# Patient Record
Sex: Male | Born: 1970 | Race: White | Hispanic: No | Marital: Married | Smoking: Current every day smoker
Health system: Southern US, Community
[De-identification: ages and names within clinical notes are randomized; demographics above are authoritative.]

---

## 2005-11-17 ENCOUNTER — Emergency Department (HOSPITAL_COMMUNITY): Admission: EM | Admit: 2005-11-17 | Discharge: 2005-11-17 | Payer: Self-pay | Admitting: Emergency Medicine

## 2017-01-05 ENCOUNTER — Emergency Department (HOSPITAL_COMMUNITY): Payer: Self-pay

## 2017-01-05 ENCOUNTER — Emergency Department (HOSPITAL_COMMUNITY)
Admission: EM | Admit: 2017-01-05 | Discharge: 2017-01-05 | Disposition: A | Discharge status: Home / Self Care | Payer: Self-pay | Attending: Emergency Medicine | Admitting: Emergency Medicine

## 2017-01-05 ENCOUNTER — Encounter (HOSPITAL_COMMUNITY): Payer: Self-pay

## 2017-01-05 DIAGNOSIS — M79642 Pain in left hand: Secondary | ICD-10-CM | POA: Insufficient documentation

## 2017-01-05 DIAGNOSIS — Y929 Unspecified place or not applicable: Secondary | ICD-10-CM | POA: Insufficient documentation

## 2017-01-05 DIAGNOSIS — Y939 Activity, unspecified: Secondary | ICD-10-CM | POA: Insufficient documentation

## 2017-01-05 DIAGNOSIS — F17200 Nicotine dependence, unspecified, uncomplicated: Secondary | ICD-10-CM | POA: Insufficient documentation

## 2017-01-05 DIAGNOSIS — Y999 Unspecified external cause status: Secondary | ICD-10-CM | POA: Insufficient documentation

## 2017-01-05 DIAGNOSIS — W010XXA Fall on same level from slipping, tripping and stumbling without subsequent striking against object, initial encounter: Secondary | ICD-10-CM | POA: Insufficient documentation

## 2017-01-05 MED ORDER — IBUPROFEN 200 MG PO TABS
600.0000 mg | ORAL_TABLET | Freq: Once | ORAL | Status: AC
  Administered 2017-01-05: 600 mg via ORAL
  Filled 2017-01-05: qty 3

## 2017-01-05 NOTE — Discharge Instructions (Signed)
Please use ibuprofen as needed for pain. Please follow [place], ice, compression, elevation. Keep an brace on for most of the day for 1-2 weeks. Please follow up with hand surgery in one to 2 weeks regarding today's visit as needed.  Contact a health care provider if: Your pain does not get better after a few days of self-care. Your pain gets worse. Your pain affects your ability to do your daily activities. Get help right away if: Your hand becomes warm, red, or swollen. Your hand is numb or tingling. Your hand is extremely swollen or deformed. Your hand or fingers turn white or blue. You cannot move your hand, wrist, or fingers.

## 2017-01-05 NOTE — ED Triage Notes (Signed)
Fell on Friday and did left hand plant now pain in left wrist and swelling noted hurts to flex hand good csm noted.

## 2017-01-05 NOTE — ED Provider Notes (Signed)
[facility]-EMERGENCY DEPT Provider Note   CSN: [id number] Arrival date & time: 01/05/17  1903     History   Chief Complaint Chief Complaint  Patient presents with  . Hand Pain    HPI Michael Reeves is a 46 y.o. male presenting to the ED with complaints of left hand pain since Friday. He reports associated swelling. He states his symptoms have been unchanged, constant, 7/10. He reports falling on outstretched left hand on Friday and having symptoms since then. He states he has tried ibuprofen with mild to moderate relief. He reports flexing hand makes symptoms worse as well as palpation to the area. He denies fevers, chills, nausea, vomiting, previous injury.  The history is provided by the patient. No language interpreter was used.  Hand Pain     History reviewed. No pertinent past medical history.  There are no active problems to display for this patient.   History reviewed. No pertinent surgical history.     Home Medications    Prior to Admission medications   Not on File    Family History History reviewed. No pertinent family history.  Social History Social History  Substance Use Topics  . Smoking status: Current Every Day Smoker  . Smokeless tobacco: Never Used  . Alcohol use Yes     Allergies   Patient has no known allergies.   Review of Systems Review of Systems  Constitutional: Negative for chills and fever.  Musculoskeletal: Positive for arthralgias.     Physical Exam Updated Vital Signs BP (!) 156/97   Pulse 89   Temp 98.1 F (36.7 C) (Oral)   Resp 14   Ht  (1.778 m)   Wt 87.5 kg   SpO2 96%   BMI 27.69 kg/m   Physical Exam  Constitutional: He appears well-developed and well-nourished.  Well appearing  HENT:  Head: Normocephalic and atraumatic.  Nose: Nose normal.  Eyes: Conjunctivae and EOM are normal.  Neck: Normal range of motion.  Cardiovascular: Normal rate and intact distal pulses.   2+ distal pulses BUE.     Pulmonary/Chest: Effort normal. No respiratory distress.  Normal work of breathing. No respiratory distress noted.   Abdominal: Soft.  Musculoskeletal: Normal range of motion.  Left hand with mild swelling. TTP over dorsal aspect of wrist. No tenderness to anatomical snuffbox. No redness, no bruising, no deformity noted. Patient with limited range of motion secondary to pain and swelling. No tenderness or swelling to any other joints of hand, no tenderness or swelling to elbow.  Neurological: He is alert.  Sensation intact to BUE and distal to area affected. Muscle strength good against wrist flexion, extension.  Skin: Skin is warm.  Psychiatric: He has a normal mood and affect. His behavior is normal.  Nursing note and vitals reviewed.    ED Treatments / Results  Labs (all labs ordered are listed, but only abnormal results are displayed) Labs Reviewed - No data to display  EKG  EKG Interpretation None       Radiology Dg Wrist Complete Left  Result Date: 01/05/2017 CLINICAL DATA:  Pain in the left hand with swelling of the left wrist EXAM: LEFT WRIST - COMPLETE 3+ VIEW COMPARISON:  None. FINDINGS: No fracture or malalignment is visualized. No significant joint space narrowing. Probable cysts in the distal scaphoid and the triquetrum. Soft tissues are grossly unremarkable. IMPRESSION: No acute osseous abnormality. Electronically Signed   By: Jasmine Pang M.D.   On: 01/05/2017 20:08   Dg Hand  Complete Left  Result Date: 01/05/2017 CLINICAL DATA:  46 year old male tripped over dog and fell 2 days ago with left hand pain and swelling. Initial encounter. EXAM: LEFT HAND - COMPLETE 3+ VIEW COMPARISON:  None. FINDINGS: Grossly intact distal left radius and ulna, see also left wrist series today reported separately. Carpal bone alignment and joint spaces appear normal. Intact metacarpals. Intact phalanges. MCP and IP joint spaces appear within normal limits. IMPRESSION: No acute fracture  or dislocation identified about the left hand. Electronically Signed   By: Odessa Fleming M.D.   On: 01/05/2017 20:07    Procedures Procedures (including critical care time)  Medications Ordered in ED Medications  ibuprofen (ADVIL,MOTRIN) tablet 600 mg (600 mg Oral Given 01/05/17 2238)     Initial Impression / Assessment and Plan / ED Course  I have reviewed the triage vital signs and the nursing notes.  Pertinent labs & imaging results that were available during my care of the patient were reviewed by me and considered in my medical decision making (see chart for details).    Patient X-Ray negative for obvious fracture or dislocation. Pain managed in ED. Pt advised to follow up with orthopedics if symptoms persist for possibility of missed fracture diagnosis. Patient given brace while in ED, conservative therapy recommended and discussed. Patient in NAD, hemodynamically stable, afebrile. Patient will be dc home & is agreeable with above plan. Return precautions given.   Final Clinical Impressions(s) / ED Diagnoses   Final diagnoses:  Left hand pain    New Prescriptions New Prescriptions   No medications on file     [address] [place], [place] 01/05/17 2246    Laurence Spates, MD 01/06/17 [phone]

## 2017-03-24 ENCOUNTER — Ambulatory Visit (HOSPITAL_COMMUNITY)
Admission: EM | Admit: 2017-03-24 | Discharge: 2017-03-24 | Disposition: A | Discharge status: Home / Self Care | Payer: Self-pay | Attending: Family Medicine | Admitting: Family Medicine

## 2017-03-24 ENCOUNTER — Encounter (HOSPITAL_COMMUNITY): Payer: Self-pay | Admitting: Family Medicine

## 2017-03-24 DIAGNOSIS — S01511A Laceration without foreign body of lip, initial encounter: Secondary | ICD-10-CM

## 2017-03-24 MED ORDER — LIDOCAINE-EPINEPHRINE (PF) 2 %-1:200000 IJ SOLN
INTRAMUSCULAR | Status: AC
  Filled 2017-03-24: qty 20

## 2017-03-24 NOTE — ED Provider Notes (Signed)
CSN: [id number]     Arrival date & time 03/24/17  1943 History   None    Chief Complaint  Patient presents with  . Laceration   (Consider location/radiation/quality/duration/timing/severity/associated sxs/prior Treatment) Patient c/o upper right lip laceration.   The history is provided by the patient.  Laceration  Location:  Face Facial laceration location:  Upper lip Length:  3 cm Depth:  Cutaneous Quality: avulsion   Bleeding: venous   Time since incident:  2 hours Laceration mechanism:  Blunt object Pain details:    Quality:  Aching   Severity:  Moderate   Timing:  Constant Foreign body present:  No foreign bodies Relieved by:  Nothing Worsened by:  Nothing Tetanus status:  Up to date   History reviewed. No pertinent past medical history. History reviewed. No pertinent surgical history. History reviewed. No pertinent family history. Social History  Substance Use Topics  . Smoking status: Current Every Day Smoker  . Smokeless tobacco: Never Used  . Alcohol use Yes    Review of Systems  Constitutional: Negative.   HENT: Negative.   Eyes: Negative.   Respiratory: Negative.   Cardiovascular: Negative.   Gastrointestinal: Negative.   Endocrine: Negative.   Genitourinary: Negative.   Musculoskeletal: Negative.   Skin: Positive for wound.  Allergic/Immunologic: Negative.   Neurological: Negative.   Hematological: Negative.   Psychiatric/Behavioral: Negative.     Allergies  Patient has no known allergies.  Home Medications   Prior to Admission medications   Not on File   Meds Ordered and Administered this Visit  Medications - No data to display  BP (!) 143/86   Pulse (!) 103   Temp 98.2 F (36.8 C)   Resp 18   SpO2 98%  No data found.   Physical Exam  Constitutional: He appears well-developed and well-nourished.  HENT:  Head: Normocephalic and atraumatic.  Eyes: Conjunctivae and EOM are normal. Pupils are equal, round, and reactive to  light.  Neck: Normal range of motion. Neck supple.  Cardiovascular: Normal rate, regular rhythm and normal heart sounds.   Pulmonary/Chest: Effort normal.  Skin:  Right upper lip laceration with flap approx 3 cm long.  Nursing note and vitals reviewed.   Urgent Care Course     .Marland Kitchen.Laceration Repair Date/Time: 03/24/2017 8:43 PM Performed by: Deatra CanterXFORD, WILLIAM J Authorized by: Deatra CanterXFORD, WILLIAM J   Consent:    Consent obtained:  Verbal   Consent given by:  Patient   Risks discussed:  Infection and pain   Alternatives discussed:  No treatment Anesthesia (see MAR for exact dosages):    Anesthesia method:  Local infiltration   Local anesthetic:  Lidocaine 1% WITH epi Laceration details:    Location:  Lip   Lip location:  Upper exterior lip   Length (cm):  3   Depth (mm):  3 Repair type:    Repair type:  Simple Pre-procedure details:    Preparation:  Patient was prepped and draped in usual sterile fashion Exploration:    Hemostasis achieved with:  Direct pressure   Wound exploration: wound explored through full range of motion     Wound extent: areolar tissue violated     Contaminated: no   Treatment:    Area cleansed with:  Betadine and saline   Amount of cleaning:  Standard   Irrigation solution:  Sterile saline   Irrigation volume:  120 ml   Irrigation method:  Syringe   Visualized foreign bodies/material removed: no   Skin repair:  Repair method:  Sutures   Suture size:  5-0   Suture material:  Nylon   Suture technique:  Simple interrupted   Number of sutures:  8 Approximation:    Approximation:  Close   Vermilion border: well-aligned   Post-procedure details:    Dressing:  Antibiotic ointment   Patient tolerance of procedure:  Tolerated well, no immediate complications Comments:     Horizontal suture placed through tip of flap and secured above flap and then # 7 simple sutures placed to approximate edges.   (including critical care time)  Labs Review Labs  Reviewed - No data to display  Imaging Review No results found.   Visual Acuity Review  Right Eye Distance:   Left Eye Distance:   Bilateral Distance:    Right Eye Near:   Left Eye Near:    Bilateral Near:         MDM   1. Lip laceration, initial encounter    #8 sutures placed with 5.0 nylon      Deatra Canter, [place] 03/24/17 2047

## 2017-03-24 NOTE — Discharge Instructions (Signed)
Follow up in 7 - 10 days for suture removal.  Placed 8 sutures.

## 2017-03-24 NOTE — ED Triage Notes (Signed)
Pt has laceration to left upper lip. sts he is up to date on tetanus.

## 2017-06-14 ENCOUNTER — Encounter (HOSPITAL_COMMUNITY): Payer: Self-pay | Admitting: Emergency Medicine

## 2017-06-14 ENCOUNTER — Inpatient Hospital Stay (HOSPITAL_COMMUNITY)
Admission: EM | Admit: 2017-06-14 | Discharge: 2017-06-18 | DRG: 372 | Disposition: A | Discharge status: Home / Self Care | Payer: Self-pay | Attending: Internal Medicine | Admitting: Internal Medicine

## 2017-06-14 ENCOUNTER — Emergency Department (HOSPITAL_COMMUNITY): Payer: Self-pay

## 2017-06-14 DIAGNOSIS — F1010 Alcohol abuse, uncomplicated: Secondary | ICD-10-CM

## 2017-06-14 DIAGNOSIS — Z806 Family history of leukemia: Secondary | ICD-10-CM

## 2017-06-14 DIAGNOSIS — E876 Hypokalemia: Secondary | ICD-10-CM | POA: Diagnosis present

## 2017-06-14 DIAGNOSIS — K760 Fatty (change of) liver, not elsewhere classified: Secondary | ICD-10-CM | POA: Diagnosis present

## 2017-06-14 DIAGNOSIS — A0472 Enterocolitis due to Clostridium difficile, not specified as recurrent: Principal | ICD-10-CM

## 2017-06-14 DIAGNOSIS — D696 Thrombocytopenia, unspecified: Secondary | ICD-10-CM

## 2017-06-14 DIAGNOSIS — R197 Diarrhea, unspecified: Secondary | ICD-10-CM

## 2017-06-14 DIAGNOSIS — Z800 Family history of malignant neoplasm of digestive organs: Secondary | ICD-10-CM

## 2017-06-14 DIAGNOSIS — D649 Anemia, unspecified: Secondary | ICD-10-CM

## 2017-06-14 DIAGNOSIS — R1310 Dysphagia, unspecified: Secondary | ICD-10-CM

## 2017-06-14 DIAGNOSIS — K766 Portal hypertension: Secondary | ICD-10-CM | POA: Diagnosis present

## 2017-06-14 DIAGNOSIS — K7030 Alcoholic cirrhosis of liver without ascites: Secondary | ICD-10-CM

## 2017-06-14 DIAGNOSIS — B3781 Candidal esophagitis: Secondary | ICD-10-CM | POA: Diagnosis present

## 2017-06-14 DIAGNOSIS — F1020 Alcohol dependence, uncomplicated: Secondary | ICD-10-CM | POA: Diagnosis present

## 2017-06-14 DIAGNOSIS — N179 Acute kidney failure, unspecified: Secondary | ICD-10-CM | POA: Diagnosis present

## 2017-06-14 DIAGNOSIS — Z23 Encounter for immunization: Secondary | ICD-10-CM

## 2017-06-14 DIAGNOSIS — K7010 Alcoholic hepatitis without ascites: Secondary | ICD-10-CM | POA: Diagnosis present

## 2017-06-14 DIAGNOSIS — K449 Diaphragmatic hernia without obstruction or gangrene: Secondary | ICD-10-CM | POA: Diagnosis present

## 2017-06-14 DIAGNOSIS — R778 Other specified abnormalities of plasma proteins: Secondary | ICD-10-CM

## 2017-06-14 DIAGNOSIS — R531 Weakness: Secondary | ICD-10-CM

## 2017-06-14 DIAGNOSIS — Z825 Family history of asthma and other chronic lower respiratory diseases: Secondary | ICD-10-CM

## 2017-06-14 DIAGNOSIS — K219 Gastro-esophageal reflux disease without esophagitis: Secondary | ICD-10-CM | POA: Diagnosis present

## 2017-06-14 DIAGNOSIS — R7989 Other specified abnormal findings of blood chemistry: Secondary | ICD-10-CM

## 2017-06-14 DIAGNOSIS — F17210 Nicotine dependence, cigarettes, uncomplicated: Secondary | ICD-10-CM | POA: Diagnosis present

## 2017-06-14 LAB — URINALYSIS, ROUTINE W REFLEX MICROSCOPIC
Bacteria, UA: NONE SEEN
Bilirubin Urine: NEGATIVE
Glucose, UA: NEGATIVE mg/dL
Ketones, ur: NEGATIVE mg/dL
Leukocytes, UA: NEGATIVE
Nitrite: NEGATIVE
Protein, ur: NEGATIVE mg/dL
SPECIFIC GRAVITY, URINE: 1.004 — AB (ref 1.005–1.030)
pH: 7 (ref 5.0–8.0)

## 2017-06-14 LAB — CBC
HCT: 32.1 % — ABNORMAL LOW (ref 39.0–52.0)
Hemoglobin: 11.8 g/dL — ABNORMAL LOW (ref 13.0–17.0)
MCH: 36 pg — AB (ref 26.0–34.0)
MCHC: 36.8 g/dL — AB (ref 30.0–36.0)
MCV: 97.9 fL (ref 78.0–100.0)
Platelets: 149 10*3/uL — ABNORMAL LOW (ref 150–400)
RBC: 3.28 MIL/uL — ABNORMAL LOW (ref 4.22–5.81)
RDW: 13.2 % (ref 11.5–15.5)
WBC: 14.9 10*3/uL — ABNORMAL HIGH (ref 4.0–10.5)

## 2017-06-14 LAB — MAGNESIUM: Magnesium: 1 mg/dL — ABNORMAL LOW (ref 1.7–2.4)

## 2017-06-14 LAB — TROPONIN I: Troponin I: 0.1 ng/mL (ref <0.03)

## 2017-06-14 LAB — PHOSPHORUS: Phosphorus: 3 mg/dL (ref 2.5–4.6)

## 2017-06-14 LAB — LIPASE, BLOOD: Lipase: 84 U/L — ABNORMAL HIGH (ref 11–51)

## 2017-06-14 MED ORDER — MAGNESIUM SULFATE 50 % IJ SOLN: 2.0000 g | Freq: Once | INTRAMUSCULAR | Status: DC

## 2017-06-14 MED ORDER — POTASSIUM CHLORIDE 10 MEQ/100ML IV SOLN
10.0000 meq | INTRAVENOUS | Status: AC
  Administered 2017-06-14 (×2): 10 meq via INTRAVENOUS
  Filled 2017-06-14 (×2): qty 100

## 2017-06-14 MED ORDER — MAGNESIUM SULFATE 2 GM/50ML IV SOLN
2.0000 g | Freq: Once | INTRAVENOUS | Status: AC
  Administered 2017-06-14: 2 g via INTRAVENOUS
  Filled 2017-06-14: qty 50

## 2017-06-14 MED ORDER — POTASSIUM CHLORIDE CRYS ER 20 MEQ PO TBCR
40.0000 meq | EXTENDED_RELEASE_TABLET | Freq: Once | ORAL | Status: AC
  Administered 2017-06-14: 40 meq via ORAL
  Filled 2017-06-14: qty 2

## 2017-06-14 MED ORDER — SODIUM CHLORIDE 0.9 % IV BOLUS (SEPSIS)
1000.0000 mL | Freq: Once | INTRAVENOUS | Status: AC
  Administered 2017-06-14: 1000 mL via INTRAVENOUS

## 2017-06-14 NOTE — ED Triage Notes (Addendum)
Patient c/o generalized abdominal pain and N/V/D x5 days. Denies SOB and chest pain. Ambulatory.

## 2017-06-14 NOTE — ED Notes (Signed)
Date and time results received: 06/14/17 9:30 PM (use smartphrase ".now" to insert current time)  Test: Troponin Critical Value: 0.10  Name of Provider Notified: Fayrene Helper, EDPA  Orders Received? Or Actions Taken?: None at this time

## 2017-06-14 NOTE — ED Notes (Signed)
RN may call report to [facility] [id number] at [phone].

## 2017-06-14 NOTE — ED Notes (Signed)
ED TO INPATIENT HANDOFF REPORT  Name/Age/Gender Michael Reeves 46 y.o. male  Code Status Code Status History    This patient does not have a recorded code status. Please follow your organizational policy for patients in this situation.      Home/SNF/Other Home  Chief Complaint Nausea and Blurry Vision and Cant hold any Fluids   Level of Care/Admitting Diagnosis ED Disposition    ED Disposition Condition Comment   Admit  Hospital Area: [place] [[id number]]  Level of Care: Telemetry [5]  Admit to tele based on following criteria: Monitor for Ischemic changes  Diagnosis: Hypokalemia [665993]  Admitting Physician: Jani Gravel [[id number]]  Attending Physician: Jani Gravel [phone]  Estimated length of stay: past midnight tomorrow  Certification:: I certify this patient will need inpatient services for at least 2 midnights  PT Class (Do Not Modify): Inpatient [101]  PT Acc Code (Do Not Modify): Private [1]       Medical History History reviewed. No pertinent past medical history.  Allergies No Known Allergies  IV Location/Drains/Wounds Patient Lines/Drains/Airways Status   Active Line/Drains/Airways    Name:   Placement date:   Placement time:   Site:   Days:   Peripheral IV 06/14/17 Left Forearm  06/14/17    1956    Forearm    less than 1   Peripheral IV 06/14/17 Right Antecubital  06/14/17    2135    Antecubital    less than 1          Labs/Imaging Results for orders placed or performed during the hospital encounter of 06/14/17 (from the past 48 hour(s))  CBC     Status: Abnormal   Collection Time: 06/14/17  6:25 PM  Result Value Ref Range   WBC 14.9 (H) 4.0 - 10.5 K/uL   RBC 3.28 (L) 4.22 - 5.81 MIL/uL   Hemoglobin 11.8 (L) 13.0 - 17.0 g/dL   HCT 32.1 (L) 39.0 - 52.0 %   MCV 97.9 78.0 - 100.0 fL   MCH 36.0 (H) 26.0 - 34.0 pg   MCHC 36.8 (H) 30.0 - 36.0 g/dL   RDW 13.2 11.5 - 15.5 %   Platelets 149 (L) 150 - 400 K/uL  Comprehensive metabolic  panel     Status: Abnormal   Collection Time: 06/14/17  7:42 PM  Result Value Ref Range   Sodium 136 135 - 145 mmol/L   Potassium 1.2 (LL) 3.5 - 5.1 mmol/L    Comment: CRITICAL RESULT CALLED TO, READ BACK BY AND VERIFIED WITH: K GILLESPIE,RN @2034  06/14/17 MKELLY    Chloride 86 (L) 101 - 111 mmol/L   CO2 36 (H) 22 - 32 mmol/L   Glucose, Bld 98 65 - 99 mg/dL   BUN 7 6 - 20 mg/dL   Creatinine, Ser 1.27 (H) 0.61 - 1.24 mg/dL   Calcium 6.9 (L) 8.9 - 10.3 mg/dL   Total Protein 6.8 6.5 - 8.1 g/dL   Albumin 3.3 (L) 3.5 - 5.0 g/dL   AST 104 (H) 15 - 41 U/L   ALT 28 17 - 63 U/L   Alkaline Phosphatase 82 38 - 126 U/L   Total Bilirubin 2.1 (H) 0.3 - 1.2 mg/dL   GFR calc non Af Amer >60 >60 mL/min   GFR calc Af Amer >60 >60 mL/min    Comment: (NOTE) The eGFR has been calculated using the CKD EPI equation. This calculation has not been validated in all clinical situations. eGFR's persistently <60 mL/min signify possible Chronic  Kidney Disease.    Anion gap 14 5 - 15  Lipase, blood     Status: Abnormal   Collection Time: 06/14/17  7:42 PM  Result Value Ref Range   Lipase 84 (H) 11 - 51 U/L  Phosphorus     Status: None   Collection Time: 06/14/17  8:54 PM  Result Value Ref Range   Phosphorus 3.0 2.5 - 4.6 mg/dL  Magnesium     Status: Abnormal   Collection Time: 06/14/17  8:54 PM  Result Value Ref Range   Magnesium 1.0 (L) 1.7 - 2.4 mg/dL  Troponin I     Status: Abnormal   Collection Time: 06/14/17  9:03 PM  Result Value Ref Range   Troponin I 0.10 (HH) <0.03 ng/mL    Comment: CRITICAL RESULT CALLED TO, READ BACK BY AND VERIFIED WITH: S DOSTER,RN @2129  06/14/17 MKELLY   Urinalysis, Routine w reflex microscopic     Status: Abnormal   Collection Time: 06/14/17 10:39 PM  Result Value Ref Range   Color, Urine YELLOW YELLOW   APPearance CLEAR CLEAR   Specific Gravity, Urine 1.004 (L) 1.005 - 1.030   pH 7.0 5.0 - 8.0   Glucose, UA NEGATIVE NEGATIVE mg/dL   Hgb urine dipstick  MODERATE (A) NEGATIVE   Bilirubin Urine NEGATIVE NEGATIVE   Ketones, ur NEGATIVE NEGATIVE mg/dL   Protein, ur NEGATIVE NEGATIVE mg/dL   Nitrite NEGATIVE NEGATIVE   Leukocytes, UA NEGATIVE NEGATIVE   RBC / HPF 0-5 0 - 5 RBC/hpf   WBC, UA 0-5 0 - 5 WBC/hpf   Bacteria, UA NONE SEEN NONE SEEN   Squamous Epithelial / LPF 0-5 (A) NONE SEEN   Dg Chest 2 View  Result Date: 06/14/2017 CLINICAL DATA:  Dehydrated, weak EXAM: CHEST  2 VIEW COMPARISON:  None. FINDINGS: The heart size and mediastinal contours are within normal limits. Both lungs are clear. The visualized skeletal structures are unremarkable. IMPRESSION: No active cardiopulmonary disease. Electronically Signed   By: Donavan Foil M.D.   On: 06/14/2017 22:38    Pending Labs Unresulted Labs    None      Vitals/Pain Today's Vitals   06/14/17 1826 06/14/17 1952 06/14/17 2052 06/14/17 2143  BP:    111/72  Pulse:    72  Resp:    17  Temp:   98.5 F (36.9 C)   TempSrc:      SpO2:    97%  Weight:  175 lb (79.4 kg)    Height:  5' 10"  (1.778 m)    PainSc: 5        Isolation Precautions No active isolations  Medications Medications  sodium chloride 0.9 % bolus 1,000 mL (0 mLs Intravenous Stopped 06/14/17 2200)  potassium chloride 10 mEq in 100 mL IVPB (10 mEq Intravenous New Bag/Given 06/14/17 2205)  potassium chloride SA (K-DUR,KLOR-CON) CR tablet 40 mEq (40 mEq Oral Given 06/14/17 2113)  magnesium sulfate IVPB 2 g 50 mL (0 g Intravenous Stopped 06/14/17 2304)    Mobility walks

## 2017-06-14 NOTE — ED Notes (Signed)
Seizure pads in place for safety due to low potassium

## 2017-06-14 NOTE — H&P (Signed)
TRH H&P   Patient Demographics:    Michael Reeves, is a 46 y.o. male  MRN: [id number]   DOB - 1971/04/19  Admit Date - 06/14/2017  Outpatient Primary MD for the patient is Patient, No Pcp Per  Referring MD/NP/PA: Domenic Moras  Outpatient Specialists:   Patient coming from: home  Chief Complaint  Patient presents with  . Abdominal Pain      HPI:    Michael Reeves  is a 46 y.o. male, w etoh dep, who apparently drinks about 3/4 5th of liquor per day presents with c/o n/v, and dysphagia to solids/liquids since Monday as well as diarrhea 1-4x per day since Tuesday.  Pt denies fever, chills, abd pain, constipation, brbpr, black stool, dysuria, hematuria.  Pt denies any odd food eaten or recent travel.  He works [employer]  [place].  Pt felt generally weak and has chronic heartburn.    In ED,  Pt found to have severe hypokalemia with K=1.2, magnesium =   1.0,. Ast 104, Alt 28  > 3 to 1 ratio which correlates with his history of etoh dep.  Trop 0.1, EKG NSR at 85, nl axis, nl pr, prolonged qtc, st depression in 2, 3, avf, and v3-6.  Pt denies chest pain.  Pt will be admitted for profound hypokalemia likely due to etoh use, lipase elevation r/o pancreatitis and troponin elevation. (asymptomatic), r/o alcoholic cardiomyopathy    Review of systems:    In addition to the HPI above,  No Fever-chills, No Headache, No changes with Vision or hearing, No problems swallowing food or Liquids, No Chest pain, Cough or Shortness of Breath, No Abdominal pain,  No Blood in stool or Urine, No dysuria, No new skin rashes or bruises, No new joints pains-aches,  No new weakness, tingling, numbness in any extremity, No recent weight gain or loss, No polyuria, polydypsia or polyphagia, No significant Mental Stressors.  A full 10 point Review of Systems was done, except as stated above, all  other Review of Systems were negative.   With Past History of the following :    History reviewed. No pertinent past medical history.    History reviewed. No pertinent surgical history.    Social History:     Social History  Substance Use Topics  . Smoking status: Current Every Day Smoker  . Smokeless tobacco: Never Used  . Alcohol use Yes     Lives - at home Mobility -  Walks by self   Family History :     Family History  Problem Relation Age of Onset  . Cancer - Colon Mother   . COPD Mother   . Acute myelogenous leukemia Father       Home Medications:   Prior to Admission medications   Medication Sig Start Date End Date Taking? Authorizing Provider  ibuprofen (ADVIL,MOTRIN) 200 MG tablet Take  600 mg by mouth every 6 (six) hours as needed for moderate pain.   Yes [provider]     Allergies:    No Known Allergies   Physical Exam:   Vitals  Blood pressure 111/72, pulse 72, temperature 98.5 F (36.9 C), resp. rate 17, height 5' 10"  (1.778 m), weight 79.4 kg (175 lb), SpO2 97 %.   1. General  lying in bed in NAD,   2. Normal affect and insight, Not Suicidal or Homicidal, Awake Alert, Oriented X 3.  3. No F.N deficits, ALL C.Nerves Intact, Strength 5/5 all 4 extremities, Sensation intact all 4 extremities, Plantars down going.  4. Ears and Eyes appear Normal, Conjunctivae clear, PERRLA. Moist Oral Mucosa.  5. Supple Neck, No JVD, No cervical lymphadenopathy appriciated, No Carotid Bruits.  6. Symmetrical Chest wall movement, Good air movement bilaterally, CTAB.  7. RRR, No Gallops, Rubs or Murmurs, No Parasternal Heave.  8. Positive Bowel Sounds, Abdomen Soft, No tenderness, No organomegaly appriciated,No rebound -guarding or rigidity.  9.  No Cyanosis, Normal Skin Turgor, No Skin Rash or Bruise.  10. Good muscle tone,  joints appear normal , no effusions, Normal ROM.  11. No Palpable Lymph Nodes in Neck or Axillae  No palmar  erythema, no asterixis.    Data Review:    CBC  Recent Labs Lab 06/14/17 1825  WBC 14.9*  HGB 11.8*  HCT 32.1*  PLT 149*  MCV 97.9  MCH 36.0*  MCHC 36.8*  RDW 13.2   ------------------------------------------------------------------------------------------------------------------  Chemistries   Recent Labs Lab 06/14/17 1942 06/14/17 2054  NA 136  --   K 1.2*  --   CL 86*  --   CO2 36*  --   GLUCOSE 98  --   BUN 7  --   CREATININE 1.27*  --   CALCIUM 6.9*  --   MG  --  1.0*  AST 104*  --   ALT 28  --   ALKPHOS 82  --   BILITOT 2.1*  --    ------------------------------------------------------------------------------------------------------------------ estimated creatinine clearance is 74.2 mL/min (A) (by C-G formula based on SCr of 1.27 mg/dL (H)). ------------------------------------------------------------------------------------------------------------------ No results for input(s): TSH, T4TOTAL, T3FREE, THYROIDAB in the last 72 hours.  Invalid input(s): FREET3  Coagulation profile No results for input(s): INR, PROTIME in the last 168 hours. ------------------------------------------------------------------------------------------------------------------- No results for input(s): DDIMER in the last 72 hours. -------------------------------------------------------------------------------------------------------------------  Cardiac Enzymes  Recent Labs Lab 06/14/17 2103  TROPONINI 0.10*   ------------------------------------------------------------------------------------------------------------------ No results found for: BNP   ---------------------------------------------------------------------------------------------------------------  Urinalysis    Component Value Date/Time   COLORURINE YELLOW 06/14/2017 2239   APPEARANCEUR CLEAR 06/14/2017 2239   LABSPEC 1.004 (L) 06/14/2017 2239   PHURINE 7.0 06/14/2017 2239   GLUCOSEU NEGATIVE  06/14/2017 2239   HGBUR MODERATE (A) 06/14/2017 2239   BILIRUBINUR NEGATIVE 06/14/2017 2239   KETONESUR NEGATIVE 06/14/2017 2239   PROTEINUR NEGATIVE 06/14/2017 2239   NITRITE NEGATIVE 06/14/2017 2239   LEUKOCYTESUR NEGATIVE 06/14/2017 2239    ----------------------------------------------------------------------------------------------------------------   Imaging Results:    Dg Chest 2 View  Result Date: 06/14/2017 CLINICAL DATA:  Dehydrated, weak EXAM: CHEST  2 VIEW COMPARISON:  None. FINDINGS: The heart size and mediastinal contours are within normal limits. Both lungs are clear. The visualized skeletal structures are unremarkable. IMPRESSION: No active cardiopulmonary disease. Electronically Signed   By: Donavan Foil M.D.   On: 06/14/2017 22:38      Assessment & Plan:    Principal Problem:  Hypokalemia Active Problems:   Hypomagnesemia   Elevated troponin   Anemia   Thrombocytopenia (HCC)   Dysphagia   Diarrhea    Hypokalemia Replete Check cmp in am  Hypomagnesemia Replete Check magnesium in am  Diarrhea Gi pathogen panel Check TSH Consider check gastrin,, 5 HIAA, celiac (defer to GI) immodium  Lipase elevation/ Abnormal LFT Check acute hepatitis panel Check CT scan abd/ pelvis R/o pancreatitis and to evaluate abnormal liver function r/o cirrhosis  N/v Check CT abd/ pelvis  Dysphagia NPO after MN Please consult GI in AM for EGD, r/o stricture, zenker, achlasia, esophagitis, malignancy protonix 63m iv bid  Anemia Check ferritin , iron, tibc, b12, folate, esr  Troponin elevation Trop I q6h x3 Check cardiac echo  ETOH dep Librium 522mpo qday x 1 day then 2529moq day x 1 day CIWA   DVT Prophylaxis lovenox, SCDs   AM Labs Ordered, also please review Full Orders  Family Communication: Admission, patients condition and plan of care including tests being ordered have been discussed with the patient  who indicate understanding and agree with  the plan and Code Status.  Code Status FULL CODE  Likely DC to  home  Condition GUARDED   Consults called: please call GI in am to evaluate dysphagia  Admission status: inpatient  Time spent in minutes : 45 minutes   JamJani GravelD on 06/14/2017 at 11:52 PM  Between 7am to 7pm - Pager - [phone] 7pm go to [email or web address] - password [email or web address] - Office  [phone]

## 2017-06-14 NOTE — ED Notes (Signed)
ED Provider at bedside. 

## 2017-06-14 NOTE — ED Provider Notes (Signed)
[facility]-EMERGENCY DEPT Provider Note   CSN: [id number] Arrival date & time: 06/14/17  1714     History   Chief Complaint Chief Complaint  Patient presents with  . Abdominal Pain    HPI Michael Reeves is a 46 y.o. male.  HPI   46 year old male presenting with trouble swallowing.  New onset of vomiting and watery diarrhea x 5 days.  Notices it most when working outside in the heat. Abd pain is intermittent only when vomits.  Has tried drinking water and gatorade but vomits when taking big gulps. Report difficulty swallowing food.  sts he eats, having difficulty swallowing and have to vomit undigested food immediately.  Reports blurry vision, and lighthededness as well as generalized muscle fatigues for the same duration.  No cp, sob.  Does admits to chronic NSAIDs use for regular aches and pain.  Hx of chronic alcohol use, quantify as six packs a day however family member at bedside states patient drinks a fifth of liquor daily.  Is a smoker and smoke 2pks/day.  Hx of chronic untreated heart burn.  Denies hematochezia, melena, hematemesis, dark stool.  No fever, or chills, no sick contact, travel hx, new exposures.  Report watery diarrhea 4 times daily.  Does report increasing stress level.  Does not have a PCP.  Denies recent abx use.    History reviewed. No pertinent past medical history.  There are no active problems to display for this patient.   History reviewed. No pertinent surgical history.     Home Medications    Prior to Admission medications   Not on File    Family History History reviewed. No pertinent family history.  Social History Social History  Substance Use Topics  . Smoking status: Current Every Day Smoker  . Smokeless tobacco: Never Used  . Alcohol use Yes     Allergies   Patient has no known allergies.   Review of Systems Review of Systems  All other systems reviewed and are negative.    Physical Exam Updated Vital Signs BP 100/76 (BP  Location: Left Arm)   Pulse 100   Temp 98.5 F (36.9 C) (Oral)   Resp 18   Ht  (1.778 m)   Wt 79.4 kg (175 lb)   SpO2 97%   BMI 25.11 kg/m   Physical Exam  Constitutional: He is oriented to person, place, and time. He appears well-developed and well-nourished. No distress.  HENT:  Head: Atraumatic.  Mouth is dry  Eyes: Conjunctivae are normal.  Neck: Neck supple.  Cardiovascular: Intact distal pulses.   Mild tachycardia without murmurs rubs or gallops  Pulmonary/Chest: Effort normal. He has rales (faint crackles).  Abdominal: Soft. Bowel sounds are normal. He exhibits no distension. There is no tenderness.  Musculoskeletal:  5/5 strength to all 4 extremities with intact patella DTR, no clonus.   Neurological: He is alert and oriented to person, place, and time.  Skin: No rash noted.  Psychiatric: He has a normal mood and affect.  Nursing note and vitals reviewed.    ED Treatments / Results  Labs (all labs ordered are listed, but only abnormal results are displayed) Labs Reviewed  CBC - Abnormal; Notable for the following:       Result Value   WBC 14.9 (*)    RBC 3.28 (*)    Hemoglobin 11.8 (*)    HCT 32.1 (*)    MCH 36.0 (*)    MCHC 36.8 (*)    Platelets  149 (*)    All other components within normal limits  COMPREHENSIVE METABOLIC PANEL - Abnormal; Notable for the following:    Potassium 1.2 (*)    Chloride 86 (*)    CO2 36 (*)    Creatinine, Ser 1.27 (*)    Calcium 6.9 (*)    Albumin 3.3 (*)    AST 104 (*)    Total Bilirubin 2.1 (*)    All other components within normal limits  LIPASE, BLOOD - Abnormal; Notable for the following:    Lipase 84 (*)    All other components within normal limits  MAGNESIUM - Abnormal; Notable for the following:    Magnesium 1.0 (*)    All other components within normal limits  TROPONIN I - Abnormal; Notable for the following:    Troponin I 0.10 (*)    All other components within normal limits  PHOSPHORUS  URINALYSIS,  ROUTINE W REFLEX MICROSCOPIC  I-STAT CHEM 8, ED    EKG  EKG Interpretation  Date/Time:  Saturday June 14 2017 19:44:50 EDT Ventricular Rate:  84 PR Interval:    QRS Duration: 197 QT Interval:  538 QTC Calculation: 637 R Axis:   71 Text Interpretation:  Sinus rhythm QT prolongation K+ 1.4 Confirmed by Rolland Porter ([id number]) on 06/14/2017 10:03:00 PM     ED ECG REPORT   Date: 06/14/2017  Rate: 84  Rhythm: normal sinus rhythm  QRS Axis: normal  Intervals: QT prolonged  ST/T Wave abnormalities: nonspecific ST/T changes  Conduction Disutrbances:right bundle branch block  Narrative Interpretation:   Old EKG Reviewed: none available  I have personally reviewed the EKG tracing and agree with the computerized printout as noted.   Radiology No results found.  Procedures Procedures (including critical care time)  Medications Ordered in ED Medications  potassium chloride 10 mEq in 100 mL IVPB (10 mEq Intravenous New Bag/Given 06/14/17 2205)  magnesium sulfate IVPB 2 g 50 mL (2 g Intravenous New Bag/Given 06/14/17 2141)  sodium chloride 0.9 % bolus 1,000 mL (0 mLs Intravenous Stopped 06/14/17 2200)  potassium chloride SA (K-DUR,KLOR-CON) CR tablet 40 mEq (40 mEq Oral Given 06/14/17 2113)     Initial Impression / Assessment and Plan / ED Course  I have reviewed the triage vital signs and the nursing notes.  Pertinent labs & imaging results that were available during my care of the patient were reviewed by me and considered in my medical decision making (see chart for details).     BP 111/72   Pulse 72   Temp 98.5 F (36.9 C)   Resp 17   Ht  (1.778 m)   Wt 79.4 kg (175 lb)   SpO2 97%   BMI 25.11 kg/m    Final Clinical Impressions(s) / ED Diagnoses   Final diagnoses:  Dysphagia, unspecified type  Elevated troponin  Hypokalemia  Hypomagnesemia  Generalized weakness    New Prescriptions New Prescriptions   No medications on file   9:46 PM Pt here with  c/o generalized weakness, as well as having difficulty swallowing both liquid and solids. No complaint of any significant pain, including no chest pain or abd pain.  Pt was found the have profound hypokalemia with K+ 1.2, hypomagnesium of 1.  Also has elevated trop of 0.1.  Mildly elevated lipase of 84, WBC 14.9.  Cr. Is 1.27, IVF given.  EKG demonstrates prolonged QT with evidence of U-waves.    Given significant electrolytes derangement, will need to trend the troponin since  pt does not have active CP.  Pt given Potassium supplementation, as well as mag sulfate.  Suspect dysphagia may be due to weakness from low potassium. However esophageal stricture or malignancy are also in the diferential.  Care discussed with Dr. Fayrene Fearing  10:20 PM Appreciate consultation from [facility] Dr. Selena Batten who agrees to see pt in the ER and will admit for further care.    CRITICAL CARE Performed by: Fayrene Helper Total critical care time: 45 minutes Critical care time was exclusive of separately billable procedures and treating other patients. Critical care was necessary to treat or prevent imminent or life-threatening deterioration. Critical care was time spent personally by me on the following activities: development of treatment plan with patient and/or surrogate as well as nursing, discussions with consultants, evaluation of patient's response to treatment, examination of patient, obtaining history from patient or surrogate, ordering and performing treatments and interventions, ordering and review of laboratory studies, ordering and review of radiographic studies, pulse oximetry and re-evaluation of patient's condition.    Fayrene Helper, PA-C 06/14/17 2221    Rolland Porter, MD 06/24/17 2053

## 2017-06-14 NOTE — ED Notes (Signed)
Pt's Chem 8 test read K <2.00 and iCA 0.75. Ina Homes, MD and RN.

## 2017-06-14 NOTE — ED Notes (Signed)
Lab called to advise recollect needed due to abnormally low potassium level.

## 2017-06-15 ENCOUNTER — Inpatient Hospital Stay (HOSPITAL_COMMUNITY): Payer: Self-pay

## 2017-06-15 ENCOUNTER — Encounter (HOSPITAL_COMMUNITY): Payer: Self-pay | Admitting: Radiology

## 2017-06-15 DIAGNOSIS — R748 Abnormal levels of other serum enzymes: Secondary | ICD-10-CM

## 2017-06-15 DIAGNOSIS — R1310 Dysphagia, unspecified: Secondary | ICD-10-CM

## 2017-06-15 DIAGNOSIS — R197 Diarrhea, unspecified: Secondary | ICD-10-CM

## 2017-06-15 DIAGNOSIS — E876 Hypokalemia: Secondary | ICD-10-CM

## 2017-06-15 DIAGNOSIS — F1010 Alcohol abuse, uncomplicated: Secondary | ICD-10-CM

## 2017-06-15 DIAGNOSIS — R531 Weakness: Secondary | ICD-10-CM

## 2017-06-15 DIAGNOSIS — R079 Chest pain, unspecified: Secondary | ICD-10-CM

## 2017-06-15 LAB — GASTROINTESTINAL PANEL BY PCR, STOOL (REPLACES STOOL CULTURE)
ADENOVIRUS F40/41: NOT DETECTED
ASTROVIRUS: NOT DETECTED
Campylobacter species: NOT DETECTED
Cryptosporidium: NOT DETECTED
Cyclospora cayetanensis: NOT DETECTED
ENTEROAGGREGATIVE E COLI (EAEC): NOT DETECTED
ENTEROTOXIGENIC E COLI (ETEC): NOT DETECTED
Entamoeba histolytica: NOT DETECTED
Enteropathogenic E coli (EPEC): NOT DETECTED
GIARDIA LAMBLIA: NOT DETECTED
NOROVIRUS GI/GII: NOT DETECTED
Plesimonas shigelloides: NOT DETECTED
ROTAVIRUS A: NOT DETECTED
SALMONELLA SPECIES: NOT DETECTED
SHIGELLA/ENTEROINVASIVE E COLI (EIEC): NOT DETECTED
Sapovirus (I, II, IV, and V): NOT DETECTED
Shiga like toxin producing E coli (STEC): NOT DETECTED
VIBRIO CHOLERAE: NOT DETECTED
Vibrio species: NOT DETECTED
Yersinia enterocolitica: NOT DETECTED

## 2017-06-15 LAB — TROPONIN I
Troponin I: 0.09 ng/mL (ref <0.03)
Troponin I: 0.07 ng/mL (ref <0.03)
Troponin I: 0.07 ng/mL (ref <0.03)

## 2017-06-15 LAB — IRON AND TIBC
IRON: 31 ug/dL — AB (ref 45–182)
SATURATION RATIOS: 12 % — AB (ref 17.9–39.5)
TIBC: 252 ug/dL (ref 250–450)
UIBC: 221 ug/dL

## 2017-06-15 LAB — BASIC METABOLIC PANEL
Anion gap: 8 (ref 5–15)
BUN: 6 mg/dL (ref 6–20)
CALCIUM: 6.3 mg/dL — AB (ref 8.9–10.3)
CHLORIDE: 101 mmol/L (ref 101–111)
CO2: 32 mmol/L (ref 22–32)
CREATININE: 1.13 mg/dL (ref 0.61–1.24)
GFR calc Af Amer: >60 mL/min (ref >60)
GFR calc non Af Amer: >60 mL/min (ref >60)
GLUCOSE: 102 mg/dL — AB (ref 65–99)
Potassium: <2 mmol/L — CL (ref 3.5–5.1)
Sodium: 141 mmol/L (ref 135–145)

## 2017-06-15 LAB — HIV ANTIBODY (ROUTINE TESTING W REFLEX): HIV Screen 4th Generation wRfx: NONREACTIVE

## 2017-06-15 LAB — ECHOCARDIOGRAM COMPLETE
HEIGHTINCHES: 70 in
Weight: 2821.89 oz

## 2017-06-15 LAB — CK TOTAL AND CKMB (NOT AT ARMC)
CK TOTAL: 5965 U/L — AB (ref 49–397)
CK, MB: 5 ng/mL (ref 0.5–5.0)
Relative Index: 0.1 (ref 0.0–2.5)

## 2017-06-15 LAB — PROTIME-INR
INR: 1.2
Prothrombin Time: 15.1 seconds (ref 11.4–15.2)

## 2017-06-15 LAB — VITAMIN B12: Vitamin B-12: 587 pg/mL (ref 180–914)

## 2017-06-15 LAB — CBC
HCT: 27.6 % — ABNORMAL LOW (ref 39.0–52.0)
Hemoglobin: 10.3 g/dL — ABNORMAL LOW (ref 13.0–17.0)
MCH: 37.5 pg — AB (ref 26.0–34.0)
MCHC: 37.3 g/dL — AB (ref 30.0–36.0)
MCV: 100.4 fL — ABNORMAL HIGH (ref 78.0–100.0)
PLATELETS: 106 10*3/uL — AB (ref 150–400)
RBC: 2.75 MIL/uL — ABNORMAL LOW (ref 4.22–5.81)
RDW: 13.5 % (ref 11.5–15.5)
WBC: 9.3 10*3/uL (ref 4.0–10.5)

## 2017-06-15 LAB — C DIFFICILE QUICK SCREEN W PCR REFLEX
C DIFFICILE (CDIFF) TOXIN: POSITIVE — AB
C DIFFICLE (CDIFF) ANTIGEN: NEGATIVE

## 2017-06-15 LAB — FERRITIN: FERRITIN: 332 ng/mL (ref 24–336)

## 2017-06-15 LAB — APTT: aPTT: 29 seconds (ref 24–36)

## 2017-06-15 LAB — MAGNESIUM: Magnesium: 1.5 mg/dL — ABNORMAL LOW (ref 1.7–2.4)

## 2017-06-15 LAB — SEDIMENTATION RATE: Sed Rate: 29 mm/hr — ABNORMAL HIGH (ref 0–16)

## 2017-06-15 LAB — TSH: TSH: 2.02 u[IU]/mL (ref 0.350–4.500)

## 2017-06-15 LAB — LIPASE, BLOOD: Lipase: 108 U/L — ABNORMAL HIGH (ref 11–51)

## 2017-06-15 MED ORDER — POTASSIUM CHLORIDE CRYS ER 20 MEQ PO TBCR
40.0000 meq | EXTENDED_RELEASE_TABLET | ORAL | Status: AC
  Administered 2017-06-15 (×3): 40 meq via ORAL
  Filled 2017-06-15 (×3): qty 2

## 2017-06-15 MED ORDER — PANTOPRAZOLE SODIUM 40 MG IV SOLR
40.0000 mg | Freq: Two times a day (BID) | INTRAVENOUS | Status: DC
  Administered 2017-06-15 – 2017-06-16 (×5): 40 mg via INTRAVENOUS
  Filled 2017-06-15 (×5): qty 40

## 2017-06-15 MED ORDER — IOPAMIDOL (ISOVUE-300) INJECTION 61%
30.0000 mL | Freq: Once | INTRAVENOUS | Status: AC | PRN
  Administered 2017-06-15: 30 mL via ORAL

## 2017-06-15 MED ORDER — POTASSIUM CHLORIDE 10 MEQ/100ML IV SOLN
10.0000 meq | INTRAVENOUS | Status: AC
  Administered 2017-06-15 (×4): 10 meq via INTRAVENOUS
  Filled 2017-06-15 (×4): qty 100

## 2017-06-15 MED ORDER — ADULT MULTIVITAMIN W/MINERALS CH
1.0000 | ORAL_TABLET | Freq: Every day | ORAL | Status: DC
  Administered 2017-06-15 – 2017-06-18 (×3): 1 via ORAL
  Filled 2017-06-15 (×4): qty 1

## 2017-06-15 MED ORDER — LORAZEPAM 2 MG/ML IJ SOLN
0.0000 mg | Freq: Two times a day (BID) | INTRAMUSCULAR | Status: DC
  Administered 2017-06-17: 1 mg via INTRAVENOUS
  Filled 2017-06-15: qty 1

## 2017-06-15 MED ORDER — VANCOMYCIN 50 MG/ML ORAL SOLUTION
125.0000 mg | Freq: Four times a day (QID) | ORAL | Status: DC
  Administered 2017-06-15 – 2017-06-18 (×13): 125 mg via ORAL
  Filled 2017-06-15 (×14): qty 2.5

## 2017-06-15 MED ORDER — CHLORDIAZEPOXIDE HCL 25 MG PO CAPS
50.0000 mg | ORAL_CAPSULE | Freq: Once | ORAL | Status: AC
  Administered 2017-06-15: 50 mg via ORAL
  Filled 2017-06-15: qty 2

## 2017-06-15 MED ORDER — THIAMINE HCL 100 MG/ML IJ SOLN: 100.0000 mg | Freq: Every day | INTRAMUSCULAR | Status: DC

## 2017-06-15 MED ORDER — LORAZEPAM 2 MG/ML IJ SOLN: 1.0000 mg | Freq: Four times a day (QID) | INTRAMUSCULAR | Status: AC | PRN

## 2017-06-15 MED ORDER — LORAZEPAM 2 MG/ML IJ SOLN: 0.0000 mg | Freq: Four times a day (QID) | INTRAMUSCULAR | Status: AC

## 2017-06-15 MED ORDER — MAGNESIUM SULFATE 2 GM/50ML IV SOLN
2.0000 g | Freq: Once | INTRAVENOUS | Status: AC
  Administered 2017-06-15: 2 g via INTRAVENOUS
  Filled 2017-06-15: qty 50

## 2017-06-15 MED ORDER — KETOROLAC TROMETHAMINE 15 MG/ML IJ SOLN
15.0000 mg | Freq: Once | INTRAMUSCULAR | Status: AC
  Administered 2017-06-15: 15 mg via INTRAVENOUS
  Filled 2017-06-15: qty 1

## 2017-06-15 MED ORDER — ENOXAPARIN SODIUM 30 MG/0.3ML ~~LOC~~ SOLN
30.0000 mg | SUBCUTANEOUS | Status: DC
  Administered 2017-06-15: 30 mg via SUBCUTANEOUS
  Filled 2017-06-15: qty 0.3

## 2017-06-15 MED ORDER — FOLIC ACID 1 MG PO TABS
1.0000 mg | ORAL_TABLET | Freq: Every day | ORAL | Status: DC
  Administered 2017-06-15 – 2017-06-18 (×3): 1 mg via ORAL
  Filled 2017-06-15 (×4): qty 1

## 2017-06-15 MED ORDER — ACETAMINOPHEN 325 MG PO TABS
650.0000 mg | ORAL_TABLET | Freq: Four times a day (QID) | ORAL | Status: DC | PRN
  Administered 2017-06-15: 650 mg via ORAL
  Filled 2017-06-15 (×2): qty 2

## 2017-06-15 MED ORDER — IOPAMIDOL (ISOVUE-300) INJECTION 61%
INTRAVENOUS | Status: AC
  Administered 2017-06-15: 15 mL
  Filled 2017-06-15: qty 30

## 2017-06-15 MED ORDER — CHLORDIAZEPOXIDE HCL 25 MG PO CAPS
25.0000 mg | ORAL_CAPSULE | Freq: Every day | ORAL | Status: AC
  Administered 2017-06-16: 25 mg via ORAL
  Filled 2017-06-15: qty 1

## 2017-06-15 MED ORDER — POTASSIUM CHLORIDE CRYS ER 20 MEQ PO TBCR
40.0000 meq | EXTENDED_RELEASE_TABLET | ORAL | Status: AC
  Administered 2017-06-15 – 2017-06-16 (×4): 40 meq via ORAL
  Filled 2017-06-15 (×4): qty 2

## 2017-06-15 MED ORDER — PNEUMOCOCCAL VAC POLYVALENT 25 MCG/0.5ML IJ INJ
0.5000 mL | INJECTION | INTRAMUSCULAR | Status: AC
  Administered 2017-06-18: 0.5 mL via INTRAMUSCULAR
  Filled 2017-06-15 (×2): qty 0.5

## 2017-06-15 MED ORDER — IOPAMIDOL (ISOVUE-300) INJECTION 61%
100.0000 mL | Freq: Once | INTRAVENOUS | Status: AC | PRN
  Administered 2017-06-15: 100 mL via INTRAVENOUS

## 2017-06-15 MED ORDER — IOPAMIDOL (ISOVUE-300) INJECTION 61%
INTRAVENOUS | Status: AC
  Administered 2017-06-15: 15 mL
  Filled 2017-06-15: qty 100

## 2017-06-15 MED ORDER — INFLUENZA VAC SPLIT QUAD 0.5 ML IM SUSY
0.5000 mL | PREFILLED_SYRINGE | INTRAMUSCULAR | Status: AC
  Administered 2017-06-18: 0.5 mL via INTRAMUSCULAR
  Filled 2017-06-15: qty 0.5

## 2017-06-15 MED ORDER — POTASSIUM CHLORIDE IN NACL 40-0.9 MEQ/L-% IV SOLN
INTRAVENOUS | Status: AC
  Administered 2017-06-15 (×2): 75 mL/h via INTRAVENOUS
  Filled 2017-06-15 (×2): qty 1000

## 2017-06-15 MED ORDER — LORAZEPAM 1 MG PO TABS: 1.0000 mg | ORAL_TABLET | Freq: Four times a day (QID) | ORAL | Status: AC | PRN

## 2017-06-15 MED ORDER — VITAMIN B-1 100 MG PO TABS
100.0000 mg | ORAL_TABLET | Freq: Every day | ORAL | Status: DC
  Administered 2017-06-15 – 2017-06-18 (×3): 100 mg via ORAL
  Filled 2017-06-15 (×4): qty 1

## 2017-06-15 NOTE — Progress Notes (Signed)
CRITICAL VALUE ALERT  Critical Value:  Potassium less than 2.0  Date & Time Notied:  06/15/17 1702  Provider Notified: Dr Elvera Lennox  Orders Received/Actions taken: dr notified

## 2017-06-15 NOTE — Progress Notes (Signed)
PROGRESS NOTE  Ethel Veronica ZOX:[id number] DOB: 1971-09-10 DOA: 06/14/2017 PCP: Patient, No Pcp Per   LOS: 1 day   Brief Narrative / Interim history: 46 y.o. male, w etoh dep, who apparently drinks about 3/4 5th of liquor per day presents with c/o n/v, and dysphagia to solids/liquids since Monday as well as diarrhea 1-4x per day since Tuesday.  Assessment & Plan: Principal Problem:   Hypokalemia Active Problems:   Hypomagnesemia   Elevated troponin   Anemia   Thrombocytopenia (HCC)   Dysphagia   Diarrhea   Diarrhea, nausea vomiting -GI pathogen panel pending, supportive treatment for now.  His diarrhea appears to have resolved and he was unable to provide stool sample -CT scan of the abdomen and pelvis is pending  Dysphagia -Sort of acute, over the last week, has difficulties eating solid and can do sips of liquids but if he does larger volume of liquids he will throw it back up.  GI consulted, appreciate input.  Lipase elevation -Probably mild pancreatitis, CT pending  Abnormal LFTs -In a pattern consistent with alcohol abuse, improving  Profound hypokalemia/hypomagnesemia -Quite critical, replete potassium aggressively, this is likely due to poor p.o. intake and GI losses as well as ongoing alcoholism  Troponin elevation -2D echo pending, cardiology consulted -EKG was noted with diffuse ST depression   DVT prophylaxis: lovenox Code Status: Full code Family Communication: sister at bedside Disposition Plan: home when ready  Consultants:   Cardiology  GI  Procedures:   None   Antimicrobials:  None   Subjective: - no chest pain, shortness of breath, no abdominal pain, nausea or vomiting currently. No diarrhea since admission  Objective: Vitals:   06/14/17 2143 06/15/17 0026 06/15/17 0607 06/15/17 0938  BP: 111/72 126/81 105/86 120/86  Pulse: 72 66 (!) 58 76  Resp: Temp:  97.9 F (36.6 C) 97.9 F (36.6 C) 98.2 F (36.8 C)  TempSrc:   Oral Oral Oral  SpO2: 97% 100% 96% 98%  Weight:   80 kg (176 lb 5.9 oz)   Height:   (1.778 m)      Intake/Output Summary (Last 24 hours) at 06/15/17 1030 Last data filed at 06/15/17 0708  Gross per 24 hour  Intake          2061.25 ml  Output              250 ml  Net          1811.25 ml   Filed Weights   06/14/17 1952 06/15/17 0607  Weight: 79.4 kg (175 lb) 80 kg (176 lb 5.9 oz)    Examination:  Constitutional: NAD Eyes: lids and conjunctivae normal ENMT: Mucous membranes are moist. No oropharyngeal exudates Respiratory: clear to auscultation bilaterally, no wheezing, no crackles.  Cardiovascular: Regular rate and rhythm, no murmurs / rubs / gallops. No LE edema.  Abdomen: no tenderness. Bowel sounds positive.  Skin: no rashes, lesions, ulcers. No induration Neurologic: CN 2-12 grossly intact. Strength 5/5 in all 4.  Psychiatric: Normal judgment and insight. Alert and oriented x 3. Normal mood.    Data Reviewed: I have independently reviewed following labs and imaging studies   CBC:  Recent Labs Lab 06/14/17 1825 06/15/17 0649  WBC 14.9* 9.3  HGB 11.8* 10.3*  HCT 32.1* 27.6*  MCV 97.9 100.4*  PLT 149* 106*   Basic Metabolic Panel:  Recent Labs Lab 06/14/17 1942 06/14/17 2054 06/15/17 0649  NA 136  --  144  K 1.2*  --  1.7*  CL 86*  --  100*  CO2 36*  --  34*  GLUCOSE 98  --  102*  BUN 7  --  6  CREATININE 1.27*  --  1.18  CALCIUM 6.9*  --  6.1*  MG  --  1.0* 1.5*  PHOS  --  3.0  --    GFR: Estimated Creatinine Clearance: 79.9 mL/min (by C-G formula based on SCr of 1.18 mg/dL). Liver Function Tests:  Recent Labs Lab 06/14/17 1942 06/15/17 0649  AST 104* 82*  ALT 28 24  ALKPHOS 82 64  BILITOT 2.1* 1.6*  PROT 6.8 5.4*  ALBUMIN 3.3* 2.6*    Recent Labs Lab 06/14/17 1942 06/15/17 0649  LIPASE 84* 108*   No results for input(s): AMMONIA in the last 168 hours. Coagulation Profile:  Recent Labs Lab 06/15/17 0649  INR 1.20    Cardiac Enzymes:  Recent Labs Lab 06/14/17 2103 06/15/17 0102 06/15/17 0649  CKTOTAL  --  5,965*  --   CKMB  --  5.0  --   TROPONINI 0.10* 0.09* 0.07*   BNP (last 3 results) No results for input(s): PROBNP in the last 8760 hours. HbA1C: No results for input(s): HGBA1C in the last 72 hours. CBG: No results for input(s): GLUCAP in the last 168 hours. Lipid Profile: No results for input(s): CHOL, HDL, LDLCALC, TRIG, CHOLHDL, LDLDIRECT in the last 72 hours. Thyroid Function Tests:  Recent Labs  06/15/17 0102  TSH 2.020   Anemia Panel:  Recent Labs  06/15/17 0102  VITAMINB12 587  FERRITIN 332  TIBC 252  IRON 31*   Urine analysis:    Component Value Date/Time   COLORURINE YELLOW 06/14/2017 2239   APPEARANCEUR CLEAR 06/14/2017 2239   LABSPEC 1.004 (L) 06/14/2017 2239   PHURINE 7.0 06/14/2017 2239   GLUCOSEU NEGATIVE 06/14/2017 2239   HGBUR MODERATE (A) 06/14/2017 2239   BILIRUBINUR NEGATIVE 06/14/2017 2239   KETONESUR NEGATIVE 06/14/2017 2239   PROTEINUR NEGATIVE 06/14/2017 2239   NITRITE NEGATIVE 06/14/2017 2239   LEUKOCYTESUR NEGATIVE 06/14/2017 2239   Sepsis Labs: Invalid input(s): PROCALCITONIN, LACTICIDVEN  No results found for this or any previous visit (from the past 240 hour(s)).    Radiology Studies: Dg Chest 2 View  Result Date: 06/14/2017 CLINICAL DATA:  Dehydrated, weak EXAM: CHEST  2 VIEW COMPARISON:  None. FINDINGS: The heart size and mediastinal contours are within normal limits. Both lungs are clear. The visualized skeletal structures are unremarkable. IMPRESSION: No active cardiopulmonary disease. Electronically Signed   By: Jasmine Pang M.D.   On: 06/14/2017 22:38     Scheduled Meds: . iopamidol      . [START ON 06/16/2017] chlordiazePOXIDE  25 mg Oral Daily  . enoxaparin (LOVENOX) injection  30 mg Subcutaneous Q24H  . folic acid  1 mg Oral Daily  . [START ON 06/16/2017] Influenza vac split quadrivalent PF  0.5 mL Intramuscular  Tomorrow-1000  . LORazepam  0-4 mg Intravenous Q6H   Followed by  . [START ON 06/17/2017] LORazepam  0-4 mg Intravenous Q12H  . multivitamin with minerals  1 tablet Oral Daily  . pantoprazole (PROTONIX) IV  40 mg Intravenous Q12H  . [START ON 06/16/2017] pneumococcal 23 valent vaccine  0.5 mL Intramuscular Tomorrow-1000  . potassium chloride SA  40 mEq Oral Q2H  . thiamine  100 mg Oral Daily   Or  . thiamine  100 mg Intravenous Daily   Continuous Infusions: . 0.9 % NaCl with  KCl 40 mEq / L 75 mL/hr (06/15/17 0131)  . magnesium sulfate 1 - 4 g bolus IVPB 2 g (06/15/17 0943)    Pamella Pert, MD, PhD [facility] Pager [phone]  If 7PM-7AM, please contact night-coverage [email or web address] Password [email or web address] 06/15/2017, 10:30 AM

## 2017-06-15 NOTE — Consult Note (Signed)
Referring Provider: Dr. Elvera Lennox    Primary Care Physician:  Patient, No Pcp Per Primary Gastroenterologist:  Gentry Fitz  Reason for Consultation:  Dysphagia, vomiting  HPI: Michael Reeves is a 46 y.o. male w etoh dep, who apparently drinks about 3/4 5th of liquor per day.  He presented with complaints of vomiting and dysphagia to solids/liquids since Monday as well as diarrhea 1-4x per day since Tuesday.  Pt denies fever, chills, abd pain, constipation, brbpr, black stool, dysuria, hematuria. Pt felt generally weak with some blurry vision.    In ED, pt found to have severe hypokalemia with K=1.2, magnesium =1.0, total bili 2.1, ALP normal, Ast 104, Alt 28  > 3 to 1 ratio which correlates with his history of etoh dep.  Trop 0.1, EKG NSR at 85, nl axis, nl pr, prolonged qtc, st depression in 2, 3, avf, and v3-6.  Lipase 84.  Hgb 11.8 grams.   He tells me that all of his issues started suddenly, even 2 weeks ago he was not having any of these complaints.  Has been having difficulties eating solid food, and can do sips of liquids but if he does larger volume of liquids then it will come right back up.  Does not have nausea or vomiting between trying to eat or drink.  No abdominal pain.  Diarrhea started on Tuesday and is yellow in color about 4 times per day.  Normally not issues with diarrhea.  No dark or bloody stools.  Admits to acid reflux as long as he can remember but only uses OTC Tagamet prn.  Takes about 1800 mg Ibuprofen about 3 times per week for several years for musculoskeletal pains.    Potassium up to 1.7 today.  Lipase up to 108.  He denies any abdominal pain.  Hgb down to 10.3 grams, which is likely dilutional drop.  Stool GI pathogen panel has been collected.  Going to for CT scan soon.  ECHO also ordered.    Is drinking CT scan contrast and says that he was actually able to get that down rather well.   History reviewed. No pertinent past medical history.  History reviewed. No  pertinent surgical history.  Prior to Admission medications   Medication Sig Start Date End Date Taking? Authorizing Provider  ibuprofen (ADVIL,MOTRIN) 200 MG tablet Take 600 mg by mouth every 6 (six) hours as needed for moderate pain.   Yes [provider]    Current Facility-Administered Medications  Medication Dose Route Frequency Provider Last Rate Last Dose  . 0.9 % NaCl with KCl 40 mEq / L  infusion   Intravenous Continuous Pearson Grippe, MD 75 mL/hr at 06/15/17 0131 75 mL/hr at 06/15/17 0131  . [START ON 06/16/2017] chlordiazePOXIDE (LIBRIUM) capsule 25 mg  25 mg Oral Daily Pearson Grippe, MD      . enoxaparin (LOVENOX) injection 30 mg  30 mg Subcutaneous Q24H Pearson Grippe, MD      . folic acid (FOLVITE) tablet 1 mg  1 mg Oral Daily Pearson Grippe, MD      . Melene Muller ON 06/16/2017] Influenza vac split quadrivalent PF (FLUARIX) injection 0.5 mL  0.5 mL Intramuscular Tomorrow-1000 Pearson Grippe, MD      . LORazepam (ATIVAN) injection 0-4 mg  0-4 mg Intravenous Q6H Pearson Grippe, MD       Followed by  . [START ON 06/17/2017] LORazepam (ATIVAN) injection 0-4 mg  0-4 mg Intravenous Q12H Pearson Grippe, MD      . LORazepam (ATIVAN)  tablet 1 mg  1 mg Oral Q6H PRN Pearson Grippe, MD       Or  . LORazepam (ATIVAN) injection 1 mg  1 mg Intravenous Q6H PRN Pearson Grippe, MD      . magnesium sulfate IVPB 2 g 50 mL  2 g Intravenous Once Leatha Gilding, MD      . multivitamin with minerals tablet 1 tablet  1 tablet Oral Daily Pearson Grippe, MD      . pantoprazole (PROTONIX) injection 40 mg  40 mg Intravenous Delories Heinz, MD   40 mg at 06/15/17 0151  . [START ON 06/16/2017] pneumococcal 23 valent vaccine (PNU-IMMUNE) injection 0.5 mL  0.5 mL Intramuscular Tomorrow-1000 Pearson Grippe, MD      . potassium chloride SA (K-DUR,KLOR-CON) CR tablet 40 mEq  40 mEq Oral Q2H Gherghe, Costin M, MD      . thiamine (VITAMIN B-1) tablet 100 mg  100 mg Oral Daily Pearson Grippe, MD       Or  . thiamine (B-1) injection 100 mg  100 mg  Intravenous Daily Pearson Grippe, MD        Allergies as of 06/14/2017  . (No Known Allergies)    Family History  Problem Relation Age of Onset  . Cancer - Colon Mother   . COPD Mother   . Acute myelogenous leukemia Father     Social History   Social History  . Marital status: Married    Spouse name: N/A  . Number of children: N/A  . Years of education: N/A   Occupational History  . Not on file.   Social History Main Topics  . Smoking status: Current Every Day Smoker  . Smokeless tobacco: Never Used  . Alcohol use Yes  . Drug use: No  . Sexual activity: No   Other Topics Concern  . Not on file   Social History Narrative  . No narrative on file    Review of Systems: ROS is negative except as mentioned in HPI.  Physical Exam: Vital signs in last 24 hours: Temp:  [97.9 F (36.6 C)-98.5 F (36.9 C)] 97.9 F (36.6 C) (09/23 0607) Pulse Rate:  [58-100] 58 (09/23 0607) Resp:  [16-18] 18 (09/23 0607) BP: (100-126)/(72-86) 105/86 (09/23 0607) SpO2:  [96 %-100 %] 96 % (09/23 0607) Weight:  [175 lb (79.4 kg)-176 lb 5.9 oz (80 kg)] 176 lb 5.9 oz (80 kg) (09/23 0607) Last BM Date: 06/14/17 General:  Alert, Well-developed, well-nourished, pleasant and cooperative in NAD Head:  Normocephalic and atraumatic. Eyes:  Sclera clear, no icterus.  Conjunctiva pink. Ears:  Normal auditory acuity. Mouth:  No deformity or lesions.   Lungs:  Clear throughout to auscultation.  No wheezes, crackles, or rhonchi.  Heart:  Slightly bradycardic but regular rhythm; no murmurs, clicks, rubs, or gallops. Abdomen:  Soft, non-distended.  BS present.  Non-tender.  Rectal:  Deferred  Msk:  Symmetrical without gross deformities. Pulses:  Normal pulses noted. Extremities:  Without clubbing or edema. Neurologic:  Alert and oriented x 4;  grossly normal neurologically. Skin:  Intact without significant lesions or rashes. Psych:  Alert and cooperative. Normal mood and affect.  Intake/Output  from previous day: 09/22 0701 - 09/23 0700 In: 1861.3 [I.V.:411.3; IV Piggyback:1450] Out: 250 [Urine:250] Intake/Output this shift: Total I/O In: 200 [IV Piggyback:200] Out: -   Lab Results:  Recent Labs  06/14/17 1825 06/15/17 0649  WBC 14.9* 9.3  HGB 11.8* 10.3*  HCT 32.1* 27.6*  PLT 149*  106*   BMET  Recent Labs  06/14/17 1942 06/15/17 0649  NA 136 144  K 1.2* 1.7*  CL 86* 100*  CO2 36* 34*  GLUCOSE 98 102*  BUN 7 6  CREATININE 1.27* 1.18  CALCIUM 6.9* 6.1*   LFT  Recent Labs  06/15/17 0649  PROT 5.4*  ALBUMIN 2.6*  AST 82*  ALT 24  ALKPHOS 64  BILITOT 1.6*   PT/INR  Recent Labs  06/15/17 0649  LABPROT 15.1  INR 1.20   Studies/Results: Dg Chest 2 View  Result Date: 06/14/2017 CLINICAL DATA:  Dehydrated, weak EXAM: CHEST  2 VIEW COMPARISON:  None. FINDINGS: The heart size and mediastinal contours are within normal limits. Both lungs are clear. The visualized skeletal structures are unremarkable. IMPRESSION: No active cardiopulmonary disease. Electronically Signed   By: Jasmine Pang M.D.   On: 06/14/2017 22:38   IMPRESSION:  Dysphagia/vomiting:  This began acutely, over the last week, has difficulties eating solid and can do sips of liquids but if he does larger volume of liquids then it will come right back up.  ? Cause.  Diarrhea:  Does not sound infectious. -GI pathogen panel pending, supportive treatment for now. -CT scan of the abdomen and pelvis is pending  Lipase elevation:  Doubt pancreatitis without abdominal pain.  This may just be related to vomiting.  Will be evaluated with CT scan.  Abnormal LFTs:  In a pattern consistent with alcohol abuse, improving.  Profound hypokalemia/hypomagnesemia:  Quite critical, replete potassium aggressively, this is likely due to poor p.o. intake and GI losses as well as ongoing alcoholism.  Troponin elevation -2D echo pending, cardiology consulted (thought due to electrolyte  derangements  ETOH abuse:  On CIWA.  Needs abstinence from ETOH.  PLAN: -Can be evaluated with EGD from GI standpoint once electrolytes are corrected adequately.  Timing to be determined, ? Monday or Tuesday. -Follow up stool studies, ECHO, and CT scan.  Aleila Syverson D.  06/15/2017, 9:39 AM  Pager number [phone]

## 2017-06-15 NOTE — Progress Notes (Signed)
Patient on Enteric Precaution and NPO with sips of water with medications only.   GI at bedside discussed testing and procedure options. Patient receptive to be scheduled for EGD.  CIWA Alcohol Scale negative throughout shift for signs or symptoms of ETOH withdrawal.  Continue with plan of care.

## 2017-06-15 NOTE — Progress Notes (Signed)
  Echocardiogram 2D Echocardiogram has been performed.  Michael Reeves 06/15/2017, 2:02 PM

## 2017-06-15 NOTE — Progress Notes (Signed)
CRITICAL VALUE ALERT  Critical Value:  C diff positive  Date & Time Notied:  06/15/17 1333  Provider Notified: Dr Elvera Lennox  Orders Received/Actions taken: Dr notified

## 2017-06-15 NOTE — Progress Notes (Signed)
CRITICAL VALUE ALERT  Critical Value:  Calcium 6.1  Date & Time Notied:  06/15/17 0745  Provider Notified: Dr Elvera Lennox   Orders Received/Actions taken: New orders received

## 2017-06-15 NOTE — Progress Notes (Signed)
CRITICAL VALUE ALERT  Critical Value:  Potassium 1.7  Date & Time Notied:  06/15/17 0745  Provider Notified: Dr Elvera Lennox   Orders Received/Actions taken: New orders received

## 2017-06-15 NOTE — Consult Note (Signed)
Cardiology Consultation:   Patient ID: Michael Reeves; [id number]; 1970/10/23   Admit date: 06/14/2017 Date of Consult: 06/15/2017  Primary Care Provider: Patient, No Pcp Per Primary Cardiologist: new , Anagabriela Jokerst  Primary Electrophysiologist:     Patient Profile:   Michael Reeves is a 46 y.o. male with a hx of Alcohol abuse who is being seen today for the evaluation of abnormal EKG and elevated troponin level at the request of Dr Elvera Lennox. .  History of Present Illness:   Michael Reeves this 46 year old gentleman with a history of alcohol abuse. He presented to the emergency room on September 22 complaining of nausea, vomiting and dysphagia. He's also had some diarrhea.  He drinks approximately three quarters of a fifth of liquor each day. He was found have profound hypokalemia with a potassium level of 1.2. His magnesium was 1.0. His troponin level was found to be mildly elevated in the and he had profound EKG changes consistent with hypokalemia.  He denies any chest pain. He does not get any exercise per se but is active and walks around all day long. 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  He drinks heavily. He smokes 2 packs of cigarettes a day.  History reviewed. No pertinent past medical history.  History reviewed. No pertinent surgical history.   Home Medications:  Prior to Admission medications   Medication Sig Start Date End Date Taking? Authorizing Provider  ibuprofen (ADVIL,MOTRIN) 200 MG tablet Take 600 mg by mouth every 6 (six) hours as needed for moderate pain.   Yes [provider]    Inpatient Medications: Scheduled Meds: . [START ON 06/16/2017] chlordiazePOXIDE  25 mg Oral Daily  . enoxaparin (LOVENOX) injection  30 mg Subcutaneous Q24H  . folic acid  1 mg Oral Daily  . [START ON 06/16/2017] Influenza vac split quadrivalent PF  0.5 mL Intramuscular Tomorrow-1000  . LORazepam  0-4 mg Intravenous Q6H   Followed by  . [START ON 06/17/2017]  LORazepam  0-4 mg Intravenous Q12H  . multivitamin with minerals  1 tablet Oral Daily  . pantoprazole (PROTONIX) IV  40 mg Intravenous Q12H  . [START ON 06/16/2017] pneumococcal 23 valent vaccine  0.5 mL Intramuscular Tomorrow-1000  . potassium chloride SA  40 mEq Oral Q2H  . thiamine  100 mg Oral Daily   Or  . thiamine  100 mg Intravenous Daily   Continuous Infusions: . 0.9 % NaCl with KCl 40 mEq / L 75 mL/hr (06/15/17 0131)  . magnesium sulfate 1 - 4 g bolus IVPB     PRN Meds: LORazepam **OR** LORazepam  Allergies:   No Known Allergies  Social History:   Social History   Social History  . Marital status: Married    Spouse name: N/A  . Number of children: N/A  . Years of education: N/A   Occupational History  . Not on file.   Social History Main Topics  . Smoking status: Current Every Day Smoker  . Smokeless tobacco: Never Used  . Alcohol use Yes  . Drug use: No  . Sexual activity: No   Other Topics Concern  . Not on file   Social History Narrative  . No narrative on file    Family History:    Family History  Problem Relation Age of Onset  . Cancer - Colon Mother   . COPD Mother   . Acute myelogenous leukemia Father      ROS:  Please see the history of present  illness.  ROS  All other ROS reviewed and negative.     Physical Exam/Data:   Vitals:   06/14/17 2052 06/14/17 2143 06/15/17 0026 06/15/17 0607  BP:  111/72 126/81 105/86  Pulse:  72 66 (!) 58  Resp:  Temp: 98.5 F (36.9 C)  97.9 F (36.6 C) 97.9 F (36.6 C)  TempSrc:   Oral Oral  SpO2:  97% 100% 96%  Weight:    176 lb 5.9 oz (80 kg)  Height:    (1.778 m)     Intake/Output Summary (Last 24 hours) at 06/15/17 0835 Last data filed at 06/15/17 0708  Gross per 24 hour  Intake          2061.25 ml  Output              250 ml  Net          1811.25 ml   Filed Weights   06/14/17 1952 06/15/17 0607  Weight: 175 lb (79.4 kg) 176 lb 5.9 oz (80 kg)   Body mass index is  25.31 kg/m.  General:  Well nourished, well developed, in no acute distress HEENT: normal Lymph: no adenopathy Neck: no JVD Endocrine:  No thryomegaly Vascular: No carotid bruits; FA pulses 2+ bilaterally without bruits  Cardiac:  normal S1, S2; RRR; no murmur  Lungs:  clear to auscultation bilaterally, no wheezing, rhonchi or rales  Abd: soft, nontender, no hepatomegaly  Ext: no edema Musculoskeletal:  No deformities, BUE and BLE strength normal and equal Ankle bracelet on left ankle  Skin: warm and dry  Neuro:  CNs 2-12 intact, no focal abnormalities noted Psych:  Normal affect   EKG:  The EKG was personally reviewed and demonstrates:  NSR with profound ST abn.   Telemetry:  Telemetry was personally reviewed and demonstrates:   NSR   Relevant CV Studies:   Laboratory Data:  Chemistry Recent Labs Lab 06/14/17 1942 06/15/17 0649  NA 136 144  K 1.2* 1.7*  CL 86* 100*  CO2 36* 34*  GLUCOSE 98 102*  BUN 7 6  CREATININE 1.27* 1.18  CALCIUM 6.9* 6.1*  GFRNONAA >60 >60  GFRAA >60 >60  ANIONGAP 14 10     Recent Labs Lab 06/14/17 1942 06/15/17 0649  PROT 6.8 5.4*  ALBUMIN 3.3* 2.6*  AST 104* 82*  ALT 28 24  ALKPHOS 82 64  BILITOT 2.1* 1.6*   Hematology Recent Labs Lab 06/14/17 1825  WBC 14.9*  RBC 3.28*  HGB 11.8*  HCT 32.1*  MCV 97.9  MCH 36.0*  MCHC 36.8*  RDW 13.2  PLT 149*   Cardiac Enzymes Recent Labs Lab 06/14/17 2103 06/15/17 0102 06/15/17 0649  TROPONINI 0.10* 0.09* 0.07*   No results for input(s): TROPIPOC in the last 168 hours.  BNPNo results for input(s): BNP, PROBNP in the last 168 hours.  DDimer No results for input(s): DDIMER in the last 168 hours.  Radiology/Studies:  Dg Chest 2 View  Result Date: 06/14/2017 CLINICAL DATA:  Dehydrated, weak EXAM: CHEST  2 VIEW COMPARISON:  None. FINDINGS: The heart size and mediastinal contours are within normal limits. Both lungs are clear. The visualized skeletal structures are unremarkable.  IMPRESSION: No active cardiopulmonary disease. Electronically Signed   By: Jasmine Pang M.D.   On: 06/14/2017 22:38    Assessment and Plan:   Elevated troponin level EKG abnormalities. I suspect that these are all due to his profound electrolyte disturbances.  Potassium level of  1.2 certainly would be expected to cause repolarization abnormalities. He denies any episodes of angina despite being on his feet and walking all day long.  The troponin elevations are minimal and are not surprising given his profound electrolyte abnormalities  Agree with getting an echo. If the echo shows no segmental wall motion abnormalities then I do not think that he needs any additional workup.  Given his lack of angina, I suspect that he does not have significant coronary artery disease. If he does have episodes of chest discomfort then we will consider further work up  I strongly advised him to greatly cut his alcohol intake and to stop smoking. He drives 3 hours every day to commute to his job. I've advised him to get closer to his home.    2.  Nausea / vomitting / diarrhea.  He needs to be evaluated for pancreatitis.    For questions or updates, please contact [facility] Please consult [email or web address] for contact info under Cardiology/STEMI.   Signed, Kristeen Miss, MD  06/15/2017 8:35 AM

## 2017-06-16 ENCOUNTER — Encounter (HOSPITAL_COMMUNITY): Payer: Self-pay | Admitting: Anesthesiology

## 2017-06-16 ENCOUNTER — Encounter (HOSPITAL_COMMUNITY)
Admission: EM | Disposition: A | Discharge status: Home / Self Care | Payer: Self-pay | Source: Home / Self Care | Attending: Internal Medicine

## 2017-06-16 DIAGNOSIS — A0472 Enterocolitis due to Clostridium difficile, not specified as recurrent: Principal | ICD-10-CM

## 2017-06-16 DIAGNOSIS — K7030 Alcoholic cirrhosis of liver without ascites: Secondary | ICD-10-CM

## 2017-06-16 LAB — COMPREHENSIVE METABOLIC PANEL
ALBUMIN: 3.3 g/dL — AB (ref 3.5–5.0)
ALBUMIN: 2.6 g/dL — AB (ref 3.5–5.0)
ALBUMIN: 2.7 g/dL — AB (ref 3.5–5.0)
ALK PHOS: 82 U/L (ref 38–126)
ALK PHOS: 64 U/L (ref 38–126)
ALK PHOS: 73 U/L (ref 38–126)
ALT: 28 U/L (ref 17–63)
ALT: 24 U/L (ref 17–63)
ALT: 24 U/L (ref 17–63)
AST: 104 U/L — AB (ref 15–41)
AST: 82 U/L — ABNORMAL HIGH (ref 15–41)
AST: 76 U/L — ABNORMAL HIGH (ref 15–41)
Anion gap: 14 (ref 5–15)
Anion gap: 10 (ref 5–15)
Anion gap: 8 (ref 5–15)
BILIRUBIN TOTAL: 1.6 mg/dL — AB (ref 0.3–1.2)
BUN: 7 mg/dL (ref 6–20)
BUN: 6 mg/dL (ref 6–20)
CALCIUM: 6.9 mg/dL — AB (ref 8.9–10.3)
CALCIUM: 6.4 mg/dL — AB (ref 8.9–10.3)
CO2: 36 mmol/L — AB (ref 22–32)
CO2: 34 mmol/L — AB (ref 22–32)
CO2: 30 mmol/L (ref 22–32)
CREATININE: 1.27 mg/dL — AB (ref 0.61–1.24)
CREATININE: 1.18 mg/dL (ref 0.61–1.24)
CREATININE: 1.07 mg/dL (ref 0.61–1.24)
Calcium: 6.1 mg/dL — CL (ref 8.9–10.3)
Chloride: 86 mmol/L — ABNORMAL LOW (ref 101–111)
Chloride: 100 mmol/L — ABNORMAL LOW (ref 101–111)
Chloride: 103 mmol/L (ref 101–111)
GFR calc Af Amer: >60 mL/min (ref >60)
GFR calc Af Amer: >60 mL/min (ref >60)
GFR calc non Af Amer: >60 mL/min (ref >60)
GFR calc non Af Amer: >60 mL/min (ref >60)
GFR calc non Af Amer: >60 mL/min (ref >60)
GLUCOSE: 98 mg/dL (ref 65–99)
GLUCOSE: 102 mg/dL — AB (ref 65–99)
GLUCOSE: 110 mg/dL — AB (ref 65–99)
Potassium: <2 mmol/L — CL (ref 3.5–5.1)
Potassium: <2 mmol/L — CL (ref 3.5–5.1)
Potassium: <2 mmol/L — CL (ref 3.5–5.1)
SODIUM: 144 mmol/L (ref 135–145)
SODIUM: 141 mmol/L (ref 135–145)
Sodium: 136 mmol/L (ref 135–145)
TOTAL PROTEIN: 5.4 g/dL — AB (ref 6.5–8.1)
Total Bilirubin: 2.1 mg/dL — ABNORMAL HIGH (ref 0.3–1.2)
Total Bilirubin: 1.6 mg/dL — ABNORMAL HIGH (ref 0.3–1.2)
Total Protein: 6.8 g/dL (ref 6.5–8.1)
Total Protein: 5.3 g/dL — ABNORMAL LOW (ref 6.5–8.1)

## 2017-06-16 LAB — CBC
HCT: 26.9 % — ABNORMAL LOW (ref 39.0–52.0)
HEMOGLOBIN: 9.9 g/dL — AB (ref 13.0–17.0)
MCH: 36.4 pg — ABNORMAL HIGH (ref 26.0–34.0)
MCHC: 36.8 g/dL — ABNORMAL HIGH (ref 30.0–36.0)
MCV: 98.9 fL (ref 78.0–100.0)
Platelets: 97 10*3/uL — ABNORMAL LOW (ref 150–400)
RBC: 2.72 MIL/uL — AB (ref 4.22–5.81)
RDW: 13.3 % (ref 11.5–15.5)
WBC: 9.2 10*3/uL (ref 4.0–10.5)

## 2017-06-16 LAB — MAGNESIUM: Magnesium: 1.9 mg/dL (ref 1.7–2.4)

## 2017-06-16 LAB — FOLATE RBC
FOLATE, HEMOLYSATE: 193 ng/mL
FOLATE, RBC: 637 ng/mL (ref >498)
HEMATOCRIT: 30.3 % — AB (ref 37.5–51.0)

## 2017-06-16 LAB — BASIC METABOLIC PANEL
Anion gap: 7 (ref 5–15)
CHLORIDE: 106 mmol/L (ref 101–111)
CO2: 28 mmol/L (ref 22–32)
CREATININE: 1.01 mg/dL (ref 0.61–1.24)
Calcium: 6.5 mg/dL — ABNORMAL LOW (ref 8.9–10.3)
GFR calc Af Amer: >60 mL/min (ref >60)
GFR calc non Af Amer: >60 mL/min (ref >60)
GLUCOSE: 85 mg/dL (ref 65–99)
POTASSIUM: 2.4 mmol/L — AB (ref 3.5–5.1)
Sodium: 141 mmol/L (ref 135–145)

## 2017-06-16 LAB — CLOSTRIDIUM DIFFICILE BY PCR: CDIFFPCR: NEGATIVE

## 2017-06-16 SURGERY — ESOPHAGOGASTRODUODENOSCOPY (EGD) WITH PROPOFOL
Anesthesia: Monitor Anesthesia Care

## 2017-06-16 MED ORDER — POTASSIUM CHLORIDE IN NACL 40-0.9 MEQ/L-% IV SOLN
INTRAVENOUS | Status: AC
  Administered 2017-06-16 – 2017-06-17 (×3): 75 mL/h via INTRAVENOUS
  Filled 2017-06-16 (×2): qty 1000

## 2017-06-16 MED ORDER — MAGNESIUM SULFATE 2 GM/50ML IV SOLN
2.0000 g | Freq: Once | INTRAVENOUS | Status: AC
  Administered 2017-06-16: 2 g via INTRAVENOUS
  Filled 2017-06-16: qty 50

## 2017-06-16 MED ORDER — POTASSIUM CHLORIDE CRYS ER 20 MEQ PO TBCR
40.0000 meq | EXTENDED_RELEASE_TABLET | ORAL | Status: AC
  Administered 2017-06-16 (×4): 40 meq via ORAL
  Filled 2017-06-16 (×4): qty 2

## 2017-06-16 MED ORDER — ENOXAPARIN SODIUM 40 MG/0.4ML ~~LOC~~ SOLN
40.0000 mg | SUBCUTANEOUS | Status: DC
  Filled 2017-06-16: qty 0.4

## 2017-06-16 MED ORDER — POTASSIUM CHLORIDE CRYS ER 20 MEQ PO TBCR
40.0000 meq | EXTENDED_RELEASE_TABLET | ORAL | Status: AC
  Administered 2017-06-16 (×3): 40 meq via ORAL
  Filled 2017-06-16 (×3): qty 2

## 2017-06-16 MED ORDER — HYDROCODONE-ACETAMINOPHEN 5-325 MG PO TABS
1.0000 | ORAL_TABLET | Freq: Four times a day (QID) | ORAL | Status: DC | PRN
  Administered 2017-06-16 – 2017-06-17 (×4): 1 via ORAL
  Administered 2017-06-18: 2 via ORAL
  Filled 2017-06-16 (×4): qty 1
  Filled 2017-06-16: qty 2

## 2017-06-16 MED ORDER — ENOXAPARIN SODIUM 40 MG/0.4ML ~~LOC~~ SOLN
40.0000 mg | SUBCUTANEOUS | Status: DC
  Administered 2017-06-16: 40 mg via SUBCUTANEOUS
  Filled 2017-06-16: qty 0.4

## 2017-06-16 NOTE — Progress Notes (Addendum)
Initial Nutrition Assessment  DOCUMENTATION CODES:   Not applicable  INTERVENTION:  - Continue FLD and further advance diet as medically feasible following EGD.  - Encourage PO intakes of meals and beverages. - RD will continue to monitor for additional nutrition-related needs.   NUTRITION DIAGNOSIS:   Inadequate oral intake related to acute illness, nausea, vomiting, poor appetite as evidenced by per patient/family report.  GOAL:   Patient will meet greater than or equal to 90% of their needs  MONITOR:   PO intake, Diet advancement, Weight trends, Labs, I & O's  REASON FOR ASSESSMENT:   Malnutrition Screening Tool  ASSESSMENT:   46 y.o. male, w etoh dep, who apparently drinks about 3/4 5th of liquor per day presents with c/o n/v, and dysphagia to solids/liquids since Monday as well as diarrhea 1-4x per day since Tuesday.  Pt denies fever, chills, abd pain, constipation, brbpr, black stool, dysuria, hematuria.  Pt denies any odd food eaten or recent travel.  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  Pt felt generally weak and has chronic heartburn.  46 y.o. male, w etoh dep, who apparently drinks about 3/4 5th of liquor per day presents with c/o n/v, and dysphagia to solids/liquids since Monday as well as diarrhea 1-4x per day since Tuesday.  Pt denies fever, chills, abd pain, constipation, brbpr, black stool, dysuria, hematuria.  Pt denies any odd food eaten or recent travel.  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  Pt felt generally weak and has chronic heartburn.    Pt seen for MST. BMI indicates overweight status. Diet advanced from NPO to FLD today at 10:05 AM and then to be NPO again at midnight d/t plan for EGD (unable to be done today d/t severe hypokalemia). Pt drinks 3/4 of a 5th of liquor each day. Over the past 4-5 days he began having N/V/D and some abdominal pain during which time he was unable to keep down any attempted solid foods. He was unable to tolerate more than sips  of liquids, such as Gatorade. Pt states that he is feeling better today and that this is the first day in several days that he is feeling hungry. Pt denies emesis today and states that last episode was last night (doc flow sheet shows 2 episodes of "clear" emesis yesterday at 9:40 AM and 8:55 PM.   Per chart review, pt is positive for C. Diff. GI PA note from today states that hypokalemia "may be multifactorial secondary to EtOH abuse and acute nausea vomiting diarrhea." Note also states suspect "esophagitis, but CT did show distal esophageal thickening and therefore need to exclude neoplasm, stricture, Barrett's esophagus."  Physical assessment shows no muscle and no fat wasting. Pt has lost 12 lbs (6% body weight) in the past 5 months. This is not significant for time frame.  Medications reviewed; 1 mg oral folic acid/day, 2 g IV Mg sulfate x1 run yesterday, daily multivitamin with minerals, 40 mg IV Protonix BID, 40 mEq oral KCl x3 doses yesterday and x4 doses today, 100 mg thiamine/day. Labs reviewed; K: <2 mmol/L, BUN: <5 mg/dL, Ca: 6.4 mg/dL, AST elevated but trending down, lipase: 108 mg/dL.    Diet Order:  Diet full liquid Room service appropriate? Yes; Fluid consistency: Thin Diet NPO time specified  Skin:  Reviewed, no issues  Last BM:  9/23  Height:   Ht Readings from Last 1 Encounters:  06/15/17  (1.778 m)    Weight:  Wt Readings from Last 1 Encounters:  06/16/17 181 lb 7 oz (82.3 kg)    Ideal Body Weight:  75.45 kg  BMI:  Body mass index is 26.03 kg/m.  Estimated Nutritional Needs:   Kcal:  1990-2135 (28-30 kcal/kg)  Protein:  70-85 grams (~1-1.2 grams/kg)  Fluid:  >/= 2.2 L/day  EDUCATION NEEDS:   No education needs identified at this time    Trenton Gammon, MS, RD, LDN, CNSC Inpatient Clinical Dietitian Pager # [phone] After hours/weekend pager # [phone]

## 2017-06-16 NOTE — Progress Notes (Signed)
Progress Note  Patient Name: Michael Reeves Date of Encounter: 06/16/2017  Primary Cardiologist: new Dr. Elease Hashimoto  Subjective   No chest pain, no SOB, stools not just liquid currently   Inpatient Medications    Scheduled Meds: . chlordiazePOXIDE  25 mg Oral Daily  . enoxaparin (LOVENOX) injection  40 mg Subcutaneous Q24H  . folic acid  1 mg Oral Daily  . Influenza vac split quadrivalent PF  0.5 mL Intramuscular Tomorrow-1000  . LORazepam  0-4 mg Intravenous Q6H   Followed by  . [START ON 06/17/2017] LORazepam  0-4 mg Intravenous Q12H  . multivitamin with minerals  1 tablet Oral Daily  . pantoprazole (PROTONIX) IV  40 mg Intravenous Q12H  . pneumococcal 23 valent vaccine  0.5 mL Intramuscular Tomorrow-1000  . potassium chloride  40 mEq Oral Q2H  . thiamine  100 mg Oral Daily   Or  . thiamine  100 mg Intravenous Daily  . vancomycin  125 mg Oral Q6H   Continuous Infusions:  PRN Meds: acetaminophen, LORazepam **OR** LORazepam   Vital Signs    Vitals:   06/15/17 1255 06/15/17 1739 06/15/17 2057 06/16/17 0351  BP: (!) 118/93 103/68 110/71 122/86  Pulse: (!) 54 68 65 81  Resp:  Temp:  98.1 F (36.7 C) 98.4 F (36.9 C) 98.4 F (36.9 C)  TempSrc:  Oral Oral Oral  SpO2:  98% 100% 100%  Weight:    181 lb 7 oz (82.3 kg)  Height:        Intake/Output Summary (Last 24 hours) at 06/16/17 0835 Last data filed at 06/15/17 1800  Gross per 24 hour  Intake              825 ml  Output              600 ml  Net              225 ml   Filed Weights   06/14/17 1952 06/15/17 0607 06/16/17 0351  Weight: 175 lb (79.4 kg) 176 lb 5.9 oz (80 kg) 181 lb 7 oz (82.3 kg)    Telemetry    SR occ SB  - Personally Reviewed  ECG    SR with improved ST depression and continue long Qtc though improved. - Personally Reviewed  Physical Exam   GEN: No acute distress.   Neck: No JVD Cardiac: RRR, no murmurs, rubs, or gallops.  Respiratory: Clear to auscultation bilaterally. GI:  Soft, nontender, non-distended  MS: No edema; No deformity. Neuro:  Nonfocal  Psych: Normal affect   Labs    Chemistry Recent Labs Lab 06/14/17 1942 06/15/17 0649 06/15/17 1627 06/16/17 0450  NA 136 144 141 141  K <2.0* <2.0* <2.0* <2.0*  CL 86* 100* 101 103  CO2 36* 34* 32 30  GLUCOSE 98 102* 102* 110*  BUN <5*  CREATININE 1.27* 1.18 1.13 1.07  CALCIUM 6.9* 6.1* 6.3* 6.4*  PROT 6.8 5.4*  --  5.3*  ALBUMIN 3.3* 2.6*  --  2.7*  AST 104* 82*  --  76*  ALT 28 24  --  24  ALKPHOS 82 64  --  73  BILITOT 2.1* 1.6*  --  1.6*  GFRNONAA >60 >60 >60 >60  GFRAA >60 >60 >60 >60  ANIONGAP Hematology Recent Labs Lab 06/14/17 1825 06/15/17 0649 06/16/17 0450  WBC 14.9* 9.3 9.2  RBC 3.28* 2.75* 2.72*  HGB 11.8* 10.3* 9.9*  HCT 32.1* 27.6* 26.9*  MCV 97.9 100.4* 98.9  MCH 36.0* 37.5* 36.4*  MCHC 36.8* 37.3* 36.8*  RDW 13.2 13.5 13.3  PLT 149* 106* 97*    Cardiac Enzymes Recent Labs Lab 06/14/17 2103 06/15/17 0102 06/15/17 0649 06/15/17 1147  TROPONINI 0.10* 0.09* 0.07* 0.07*   No results for input(s): TROPIPOC in the last 168 hours.   BNPNo results for input(s): BNP, PROBNP in the last 168 hours.   DDimer No results for input(s): DDIMER in the last 168 hours.   Radiology    Dg Chest 2 View  Result Date: 06/14/2017 CLINICAL DATA:  Dehydrated, weak EXAM: CHEST  2 VIEW COMPARISON:  None. FINDINGS: The heart size and mediastinal contours are within normal limits. Both lungs are clear. The visualized skeletal structures are unremarkable. IMPRESSION: No active cardiopulmonary disease. Electronically Signed   By: Jasmine Pang M.D.   On: 06/14/2017 22:38   Ct Abdomen Pelvis W Contrast  Result Date: 06/15/2017 CLINICAL DATA:  Nausea, vomiting and dysphagia. Upper abdominal pain. EXAM: CT ABDOMEN AND PELVIS WITH CONTRAST TECHNIQUE: Multidetector CT imaging of the abdomen and pelvis was performed using the standard protocol following bolus  administration of intravenous contrast. CONTRAST:  30mL ISOVUE-300 IOPAMIDOL (ISOVUE-300) INJECTION 61%, ISOVUE-300 IOPAMIDOL (ISOVUE-300) INJECTION 61% COMPARISON:  None. FINDINGS: Lower chest: The lung bases are clear of acute process. No infiltrates, edema or effusions. No worrisome pulmonary lesions. The heart is normal in size. No pericardial effusion. Age advanced coronary artery calcifications are noted. Moderate distal esophageal wall thickening above a moderate-sized hiatal hernia. Findings could be due to reflux esophagitis. Esophagram or endoscopy may be helpful for further evaluation and to exclude neoplasm or Barrett's esophagus. Hepatobiliary: Diffuse and fairly marked fatty infiltration of the liver. There are also changes of cirrhosis. No focal hepatic lesions or intrahepatic biliary dilatation. The gallbladder is contracted and contains gallstones. No common bile duct dilatation. Pancreas: No mass, inflammation or ductal dilatation. Small duodenum diverticulum noted near the pancreatic head. Spleen: Normal size.  No focal lesions. Adrenals/Urinary Tract: The adrenal glands and kidneys are unremarkable. Small renal cysts are noted. No renal, ureteral or bladder calculi or mass. Stomach/Bowel: The stomach and duodenum are unremarkable. The small bowel is grossly normal. The proximal small bowel is normal. There are no distal loops of small bowel which demonstrate inflammatory changes with wall thickening and submucosal edema. This most notably involves the distal and terminal ileum. There are also inflammatory changes involving the ascending colon with colonic wall thickening and submucosal edema. There are also fibrofatty type changes involving the cecum which would suggest chronic changes of inflammatory bowel disease. No mass lesions or obstructive findings. Scattered sigmoid diverticulosis and muscular wall thickening. The appendix is normal. Vascular/Lymphatic: Distal aortic and iliac  artery calcifications, advanced for age. No aneurysm or dissection. The branch vessels are normal. The major venous structures are patent. Portal venous collaterals are noted. Possible early esophageal varices. Small scattered mesenteric and retroperitoneal lymph nodes but no mass or overt adenopathy. Reproductive: The prostate gland and seminal vesicles are unremarkable. Other: Small left inguinal hernia containing fat. No inguinal adenopathy. Musculoskeletal: No significant bony findings. Advanced degenerative disc disease noted at L5-S1. IMPRESSION: 1. Moderate distal esophageal wall thickening and a moderate sized hiatal hernia. Findings could be due to reflux esophagitis but esophagram or endoscopy may be helpful to exclude neoplasm or Barrett's esophagus. 2. Age advanced vascular calcifications including coronary artery calcifications. 3. Diffuse fatty infiltration of  the liver and changes of cirrhosis without focal hepatic lesion. Portal venous hypertension but no splenomegaly or ascites. 4. Inflammatory bowel disease mainly involving segmental sections of the distal ileum and terminal ileum. There are also acute and chronic inflammatory changes involving the cecum and ascending colon. Does this patient have a history of Crohn's disease? 5. Contracted gallbladder with gallstones. Electronically Signed   By: Rudie Meyer M.D.   On: 06/15/2017 12:47    Cardiac Studies   ECHO 06/15/17 Study Conclusions  - Left ventricle: The cavity size was normal. Wall thickness was   increased in a pattern of mild LVH. Systolic function was normal.   The estimated ejection fraction was in the range of 60% to 65%.   Patient Profile     46 y.o. male with a hx of Alcohol abuse who was admitted 06/14/17 for N/V/dysphasia and diarrhea.  + Cdiff.  Chronic Heartburn.  K+ 1.2 on admit and Mg+ 1.0 along with abnormal EKG and elevated troponin level.   Assessment & Plan    Elevated troponin levels and abnormal  EKG-with ST depression and QTc prolongation, most likely due to profound electrolyte disturbance.  Denied angina. --troponin 0.10; 0.09; 0.07; 0.07  --Echo with normal EF 60-65% mild LVH --recheck EKG -ST depression improved prolonged QTc persists but improved --+ FH CAD with father, may need eval at some point even outpt.  Hypokalemia remains <2.0 with 360 meq of po K+ ? Absorbing ordered 40 meq every 2 hours by IM  hypo magnesium now to 1.9 up from 1.0  + c- diff on Vanc per Im GI following with dysphagia as well. For EGD once K+ stable.   AKI on admit now Cr 1.07   For questions or updates, please contact [facility] Please consult [email or web address] for contact info under Cardiology/STEMI.      Signed, Nada Boozer, NP  06/16/2017, 8:35 AM    History and all data above reviewed.  Patient examined.  I agree with the findings as above.  Profound hypokalemia is being replaced.  Feels OK.  No SOB.  No chest pain.  Stools more solid. The patient exam reveals COR:RRR  ,  Lungs: Clear  ,  Abd: Positive bowel sounds, no rebound no guarding, Ext No edema  .  All available labs, radiology testing, previous records reviewed. Agree with documented assessment and plan. Elevated troponin:  No indication for in patient stress testing.  Can follow with Dr. Elease Hashimoto as an out patient.  Prolonged QTc:  Improved.  Continue to correct electrolytes.    Fayrene Fearing Shamariah Shewmake  2:03 PM  06/16/2017

## 2017-06-16 NOTE — Progress Notes (Signed)
Patient ID: Michael Reeves, male   DOB: [birth year], 46 y.o.   MRN: [id number]    Progress Note   Subjective  Feels pretty good- hungry , denies any abdominal pain-diarrhea less seems to be forming Denies jitters  K+still less than 2 today - EGD cancelled   Objective   Vital signs in last 24 hours: Temp:  [98.1 F (36.7 C)-98.4 F (36.9 C)] 98.4 F (36.9 C) (09/24 0351) Pulse Rate:  [54-81] 81 (09/24 0351) Resp:  [18-20] 18 (09/24 0351) BP: (103-122)/(68-93) 122/86 (09/24 0351) SpO2:  [98 %-100 %] 100 % (09/24 0351) Weight:  [181 lb 7 oz (82.3 kg)] 181 lb 7 oz (82.3 kg) (09/24 0351) Last BM Date: 06/15/17 General:    white male in NAD Heart:  Regular rate and rhythm; no murmurs Lungs: Respirations even and unlabored, lungs CTA bilaterally Abdomen:  Soft, nontender and nondistended. Normal bowel sounds. Extremities:  Without edema.,wearing ankle monitor Neurologic:  Alert and oriented,  grossly normal neurologically. Psych:  Cooperative. Normal mood and affect.  Intake/Output from previous day: 09/23 0701 - 09/24 0700 In: 1025 [I.V.:825; IV Piggyback:200] Out: 600 [Urine:600] Intake/Output this shift: No intake/output data recorded.  Lab Results:  Recent Labs  06/14/17 1825 06/15/17 0649 06/16/17 0450  WBC 14.9* 9.3 9.2  HGB 11.8* 10.3* 9.9*  HCT 32.1* 27.6* 26.9*  PLT 149* 106* 97*   BMET  Recent Labs  06/15/17 0649 06/15/17 1627 06/16/17 0450  NA 144 141 141  K <2.0* <2.0* <2.0*  CL 100* 101 103  CO2 34* 32 30  GLUCOSE 102* 102* 110*  BUN 6 6 <5*  CREATININE 1.18 1.13 1.07  CALCIUM 6.1* 6.3* 6.4*   LFT  Recent Labs  06/16/17 0450  PROT 5.3*  ALBUMIN 2.7*  AST 76*  ALT 24  ALKPHOS 73  BILITOT 1.6*   PT/INR  Recent Labs  06/15/17 0649  LABPROT 15.1  INR 1.20    Studies/Results: Dg Chest 2 View  Result Date: 06/14/2017 CLINICAL DATA:  Dehydrated, weak EXAM: CHEST  2 VIEW COMPARISON:  None. FINDINGS: The heart size and mediastinal  contours are within normal limits. Both lungs are clear. The visualized skeletal structures are unremarkable. IMPRESSION: No active cardiopulmonary disease. Electronically Signed   By: Jasmine Pang M.D.   On: 06/14/2017 22:38   Ct Abdomen Pelvis W Contrast  Result Date: 06/15/2017 CLINICAL DATA:  Nausea, vomiting and dysphagia. Upper abdominal pain. EXAM: CT ABDOMEN AND PELVIS WITH CONTRAST TECHNIQUE: Multidetector CT imaging of the abdomen and pelvis was performed using the standard protocol following bolus administration of intravenous contrast. CONTRAST:  30mL ISOVUE-300 IOPAMIDOL (ISOVUE-300) INJECTION 61%, ISOVUE-300 IOPAMIDOL (ISOVUE-300) INJECTION 61% COMPARISON:  None. FINDINGS: Lower chest: The lung bases are clear of acute process. No infiltrates, edema or effusions. No worrisome pulmonary lesions. The heart is normal in size. No pericardial effusion. Age advanced coronary artery calcifications are noted. Moderate distal esophageal wall thickening above a moderate-sized hiatal hernia. Findings could be due to reflux esophagitis. Esophagram or endoscopy may be helpful for further evaluation and to exclude neoplasm or Barrett's esophagus. Hepatobiliary: Diffuse and fairly marked fatty infiltration of the liver. There are also changes of cirrhosis. No focal hepatic lesions or intrahepatic biliary dilatation. The gallbladder is contracted and contains gallstones. No common bile duct dilatation. Pancreas: No mass, inflammation or ductal dilatation. Small duodenum diverticulum noted near the pancreatic head. Spleen: Normal size.  No focal lesions. Adrenals/Urinary Tract: The adrenal glands and kidneys are unremarkable. Small renal  cysts are noted. No renal, ureteral or bladder calculi or mass. Stomach/Bowel: The stomach and duodenum are unremarkable. The small bowel is grossly normal. The proximal small bowel is normal. There are no distal loops of small bowel which demonstrate inflammatory changes  with wall thickening and submucosal edema. This most notably involves the distal and terminal ileum. There are also inflammatory changes involving the ascending colon with colonic wall thickening and submucosal edema. There are also fibrofatty type changes involving the cecum which would suggest chronic changes of inflammatory bowel disease. No mass lesions or obstructive findings. Scattered sigmoid diverticulosis and muscular wall thickening. The appendix is normal. Vascular/Lymphatic: Distal aortic and iliac artery calcifications, advanced for age. No aneurysm or dissection. The branch vessels are normal. The major venous structures are patent. Portal venous collaterals are noted. Possible early esophageal varices. Small scattered mesenteric and retroperitoneal lymph nodes but no mass or overt adenopathy. Reproductive: The prostate gland and seminal vesicles are unremarkable. Other: Small left inguinal hernia containing fat. No inguinal adenopathy. Musculoskeletal: No significant bony findings. Advanced degenerative disc disease noted at L5-S1. IMPRESSION: 1. Moderate distal esophageal wall thickening and a moderate sized hiatal hernia. Findings could be due to reflux esophagitis but esophagram or endoscopy may be helpful to exclude neoplasm or Barrett's esophagus. 2. Age advanced vascular calcifications including coronary artery calcifications. 3. Diffuse fatty infiltration of the liver and changes of cirrhosis without focal hepatic lesion. Portal venous hypertension but no splenomegaly or ascites. 4. Inflammatory bowel disease mainly involving segmental sections of the distal ileum and terminal ileum. There are also acute and chronic inflammatory changes involving the cecum and ascending colon. Does this patient have a history of Crohn's disease? 5. Contracted gallbladder with gallstones. Electronically Signed   By: P.  Gallerani M.D.   On: 06/15/2017 12:47       Assessment / Plan:    #1  46 yo male   Admitted yesterday after a 5 day history of nausea vomiting and diarrhea. Patient has tested positive for C. difficile, and is now on vancomycin. Diarrhea improving #2 profound hypokalemia, and hypomagnesemia-may be multifactorial secondary to EtOH abuse and acute nausea vomiting diarrhea. Being replaced aggressively #3 dysphagia-acute just over this past week, suspect secondary to esophagitis, but CT did show distal esophageal thickening and therefore need to exclude neoplasm, stricture, Barrett's esophagus #4 colonic inflammatory changes on CT-likely secondary to C. difficile colitis #5 elevated LFTs-secondary to EtOH abuse #6 elevated troponins-cardiology is evaluating, he'll secondary to profound electrolyte disturbance  Plan; upper endoscopy canceled for today secondary to persistent profound hypokalemia We'll start full liquid diet, nothing by mouth after midnight and reschedule for EGD tomorrow Dr. Armbruster. Hold Lovenox this evening in case esophageal dilation needed Continue vancomycin 125 mg by mouth 4 times a day 14 days total Await final cardiology recommendations, and echo   Discharge Planning    Contact  Lateef Juncaj, P.A.-C               [phone]      Principal Problem:   Hypokalemia Active Problems:   Hypomagnesemia   Elevated troponin   Anemia   Thrombocytopenia (HCC)   Dysphagia   Diarrhea   Alcohol abuse     LOS: 2 days   Lynora Dymond  06/16/2017, 9:55 AM  

## 2017-06-16 NOTE — Anesthesia Preprocedure Evaluation (Deleted)
Anesthesia Evaluation  Patient identified by MRN, date of birth, ID band Patient awake    Reviewed: Allergy & Precautions, NPO status , Patient's Chart, lab work & pertinent test results  Airway Mallampati: II  TM Distance: >3 FB Neck ROM: Full    Dental  (+) Dental Advisory Given   Pulmonary Current Smoker,    Pulmonary exam normal breath sounds clear to auscultation       Cardiovascular negative cardio ROS Normal cardiovascular exam Rhythm:Regular Rate:Normal     Neuro/Psych negative neurological ROS  negative psych ROS   GI/Hepatic negative GI ROS, (+)     substance abuse  alcohol use,   Endo/Other  negative endocrine ROS  Renal/GU negative Renal ROS  negative genitourinary   Musculoskeletal negative musculoskeletal ROS (+)   Abdominal   Peds  Hematology  (+) anemia , thrombocytopenia   Anesthesia Other Findings Severe hypokalemia and hypocalcemia  Reproductive/Obstetrics                             Anesthesia Physical Anesthesia Plan  ASA: III  Anesthesia Plan: MAC   Post-op Pain Management:    Induction: Intravenous  PONV Risk Score and Plan: Treatment may vary due to age or medical condition and Propofol infusion  Airway Management Planned: Nasal Cannula and Natural Airway  Additional Equipment: None  Intra-op Plan:   Post-operative Plan:   Informed Consent: I have reviewed the patients History and Physical, chart, labs and discussed the procedure including the risks, benefits and alternatives for the proposed anesthesia with the patient or authorized representative who has indicated his/her understanding and acceptance.   Dental advisory given  Plan Discussed with: CRNA  Anesthesia Plan Comments:         Anesthesia Quick Evaluation

## 2017-06-16 NOTE — Progress Notes (Signed)
CRITICAL VALUE ALERT  Critical Value:  K- <2, Ca- 6.4  Date & Time Notied:  06/16/17, 0600  Provider Notified: [place], X  Orders Received/Actions taken:

## 2017-06-16 NOTE — Progress Notes (Signed)
CRITICAL VALUE ALERT  Critical Value:  Potassium 2.4  Date & Time Notied:  06/16/17 1725  Provider Notified:  Yes, Dr Elvera Lennox  Orders Received/Actions taken: pending

## 2017-06-16 NOTE — Progress Notes (Signed)
Called infection prevention, spoke with Diannia Ruder, to assess need for enteric precautions, stated it was not necessary to continue in light of results

## 2017-06-16 NOTE — Progress Notes (Signed)
PROGRESS NOTE  Michael Reeves WUJ:[id number] DOB: 04-26-1971 DOA: 06/14/2017 PCP: Patient, No Pcp Per   LOS: 2 days   Brief Narrative / Interim history: 46 y.o. male, w etoh dep, who apparently drinks about 3/4 5th of liquor per day presents with c/o n/v, and dysphagia to solids/liquids since Monday as well as diarrhea 1-4x per day since Tuesday.  Assessment & Plan: Principal Problem:   Hypokalemia Active Problems:   Hypomagnesemia   Elevated troponin   Anemia   Thrombocytopenia (HCC)   Dysphagia   Diarrhea   Alcohol abuse   Diarrhea due to C. difficile colitis, nausea vomiting -GI pathogen panel negative -He tested positive for C. difficile, started on p.o. vancomycin on 9/23.  Stools are beginning to form more today. -CT scan of the abdomen and pelvis with evidence of segmental resection of the distal ileum, terminal ileum as well as the cecum and ascending colon with inflammatory changes.  GI following.  Dysphagia -acute, over the last week, has difficulties eating solid and can do sips of liquids but if he does larger volume of liquids he will throw it back up.  GI consulted, appreciate input.  Plan for EGD, however he is at risk for anesthesia due to profound hypokalemia, will replete potassium prior to doing the EGD  Lipase elevation -CT scan without significant pancreatic inflammation  Fatty liver/changes of cirrhosis/portal hypertension -Found that the CT scan, discussed with the patient, he will need to quit alcohol completely  Abnormal LFTs -In a pattern consistent with alcohol abuse, improving  Profound hypokalemia/hypomagnesemia -Quite critical, replete potassium aggressively, this is likely due to poor p.o. intake and GI losses as well as ongoing alcoholism -Requires ongoing aggressive repletion  Troponin elevation -2D echo without major findings -EKG was noted with diffuse ST depression   DVT prophylaxis: lovenox Code Status: Full code Family  Communication: sister at bedside Disposition Plan: home when ready  Consultants:   Cardiology  GI  Procedures:   2D echo Study Conclusions - Left ventricle: The cavity size was normal. Wall thickness was increased in a pattern of mild LVH. Systolic function was normal. The estimated ejection fraction was in the range of 60% to 65%.  Antimicrobials:  None   Subjective: -He is hungry, denies any pain, appreciates his stools have been more formed recently.  Objective: Vitals:   06/15/17 1255 06/15/17 1739 06/15/17 2057 06/16/17 0351  BP: (!) 118/93 103/68 110/71 122/86  Pulse: (!) 54 68 65 81  Resp:  Temp:  98.1 F (36.7 C) 98.4 F (36.9 C) 98.4 F (36.9 C)  TempSrc:  Oral Oral Oral  SpO2:  98% 100% 100%  Weight:    82.3 kg (181 lb 7 oz)  Height:        Intake/Output Summary (Last 24 hours) at 06/16/17 1152 Last data filed at 06/15/17 1800  Gross per 24 hour  Intake              600 ml  Output              600 ml  Net                0 ml   Filed Weights   06/14/17 1952 06/15/17 0607 06/16/17 0351  Weight: 79.4 kg (175 lb) 80 kg (176 lb 5.9 oz) 82.3 kg (181 lb 7 oz)    Examination:  Constitutional: NAD, calm, comfortable Eyes: lids and conjunctivae normal ENMT: Mucous membranes are moist.  Neck:  normal, supple Respiratory: clear to auscultation bilaterally, no wheezing, no crackles. Normal respiratory effort.  Cardiovascular: Regular rate and rhythm, no murmurs / rubs / gallops. No LE edema. 2+ pedal pulses.  Abdomen: no tenderness. Bowel sounds positive.    Data Reviewed: I have independently reviewed following labs and imaging studies   CBC:  Recent Labs Lab 06/14/17 1825 06/15/17 0649 06/16/17 0450  WBC 14.9* 9.3 9.2  HGB 11.8* 10.3* 9.9*  HCT 32.1* 27.6* 26.9*  MCV 97.9 100.4* 98.9  PLT 149* 106* 97*   Basic Metabolic Panel:  Recent Labs Lab 06/14/17 1942 06/14/17 2054 06/15/17 0649 06/15/17 1627 06/16/17 0450  NA 136  --   144 141 141  K <2.0*  --  <2.0* <2.0* <2.0*  CL 86*  --  100* 101 103  CO2 36*  --  34* 32 30  GLUCOSE 98  --  102* 102* 110*  BUN 7  --  6 6 <5*  CREATININE 1.27*  --  1.18 1.13 1.07  CALCIUM 6.9*  --  6.1* 6.3* 6.4*  MG  --  1.0* 1.5*  --  1.9  PHOS  --  3.0  --   --   --    GFR: Estimated Creatinine Clearance: 88.1 mL/min (by C-G formula based on SCr of 1.07 mg/dL). Liver Function Tests:  Recent Labs Lab 06/14/17 1942 06/15/17 0649 06/16/17 0450  AST 104* 82* 76*  ALT ALKPHOS 82 64 73  BILITOT 2.1* 1.6* 1.6*  PROT 6.8 5.4* 5.3*  ALBUMIN 3.3* 2.6* 2.7*    Recent Labs Lab 06/14/17 1942 06/15/17 0649  LIPASE 84* 108*   No results for input(s): AMMONIA in the last 168 hours. Coagulation Profile:  Recent Labs Lab 06/15/17 0649  INR 1.20   Cardiac Enzymes:  Recent Labs Lab 06/14/17 2103 06/15/17 0102 06/15/17 0649 06/15/17 1147  CKTOTAL  --  5,965*  --   --   CKMB  --  5.0  --   --   TROPONINI 0.10* 0.09* 0.07* 0.07*   BNP (last 3 results) No results for input(s): PROBNP in the last 8760 hours. HbA1C: No results for input(s): HGBA1C in the last 72 hours. CBG: No results for input(s): GLUCAP in the last 168 hours. Lipid Profile: No results for input(s): CHOL, HDL, LDLCALC, TRIG, CHOLHDL, LDLDIRECT in the last 72 hours. Thyroid Function Tests:  Recent Labs  06/15/17 0102  TSH 2.020   Anemia Panel:  Recent Labs  06/15/17 0102  VITAMINB12 587  FERRITIN 332  TIBC 252  IRON 31*   Urine analysis:    Component Value Date/Time   COLORURINE YELLOW 06/14/2017 2239   APPEARANCEUR CLEAR 06/14/2017 2239   LABSPEC 1.004 (L) 06/14/2017 2239   PHURINE 7.0 06/14/2017 2239   GLUCOSEU NEGATIVE 06/14/2017 2239   HGBUR MODERATE (A) 06/14/2017 2239   BILIRUBINUR NEGATIVE 06/14/2017 2239   KETONESUR NEGATIVE 06/14/2017 2239   PROTEINUR NEGATIVE 06/14/2017 2239   NITRITE NEGATIVE 06/14/2017 2239   LEUKOCYTESUR NEGATIVE 06/14/2017 2239    Sepsis Labs: Invalid input(s): PROCALCITONIN, LACTICIDVEN  Recent Results (from the past 240 hour(s))  Gastrointestinal Panel by PCR , Stool     Status: None   Collection Time: 06/15/17 12:44 AM  Result Value Ref Range Status   Campylobacter species NOT DETECTED NOT DETECTED Final   Plesimonas shigelloides NOT DETECTED NOT DETECTED Final   Salmonella species NOT DETECTED NOT DETECTED Final   Yersinia enterocolitica NOT DETECTED NOT DETECTED Final  Vibrio species NOT DETECTED NOT DETECTED Final   Vibrio cholerae NOT DETECTED NOT DETECTED Final   Enteroaggregative E coli (EAEC) NOT DETECTED NOT DETECTED Final   Enteropathogenic E coli (EPEC) NOT DETECTED NOT DETECTED Final   Enterotoxigenic E coli (ETEC) NOT DETECTED NOT DETECTED Final   Shiga like toxin producing E coli (STEC) NOT DETECTED NOT DETECTED Final   Shigella/Enteroinvasive E coli (EIEC) NOT DETECTED NOT DETECTED Final   Cryptosporidium NOT DETECTED NOT DETECTED Final   Cyclospora cayetanensis NOT DETECTED NOT DETECTED Final   Entamoeba histolytica NOT DETECTED NOT DETECTED Final   Giardia lamblia NOT DETECTED NOT DETECTED Final   Adenovirus F40/41 NOT DETECTED NOT DETECTED Final   Astrovirus NOT DETECTED NOT DETECTED Final   Norovirus GI/GII NOT DETECTED NOT DETECTED Final   Rotavirus A NOT DETECTED NOT DETECTED Final   Sapovirus (I, II, IV, and V) NOT DETECTED NOT DETECTED Final  C difficile quick scan w PCR reflex     Status: Abnormal   Collection Time: 06/15/17 12:44 AM  Result Value Ref Range Status   C Diff antigen NEGATIVE NEGATIVE Final   C Diff toxin POSITIVE (A) NEGATIVE Final    Comment: CRITICAL RESULT CALLED TO, READ BACK BY AND VERIFIED WITH: DARK,T RN [id number] COVINGTON,N    C Diff interpretation Results are indeterminate. See PCR results.  Final      Radiology Studies: Dg Chest 2 View  Result Date: 06/14/2017 CLINICAL DATA:  Dehydrated, weak EXAM: CHEST  2 VIEW COMPARISON:  None. FINDINGS:  The heart size and mediastinal contours are within normal limits. Both lungs are clear. The visualized skeletal structures are unremarkable. IMPRESSION: No active cardiopulmonary disease. Electronically Signed   By: Jasmine Pang M.D.   On: 06/14/2017 22:38   Ct Abdomen Pelvis W Contrast  Result Date: 06/15/2017 CLINICAL DATA:  Nausea, vomiting and dysphagia. Upper abdominal pain. EXAM: CT ABDOMEN AND PELVIS WITH CONTRAST TECHNIQUE: Multidetector CT imaging of the abdomen and pelvis was performed using the standard protocol following bolus administration of intravenous contrast. CONTRAST:  30mL ISOVUE-300 IOPAMIDOL (ISOVUE-300) INJECTION 61%, ISOVUE-300 IOPAMIDOL (ISOVUE-300) INJECTION 61% COMPARISON:  None. FINDINGS: Lower chest: The lung bases are clear of acute process. No infiltrates, edema or effusions. No worrisome pulmonary lesions. The heart is normal in size. No pericardial effusion. Age advanced coronary artery calcifications are noted. Moderate distal esophageal wall thickening above a moderate-sized hiatal hernia. Findings could be due to reflux esophagitis. Esophagram or endoscopy may be helpful for further evaluation and to exclude neoplasm or Barrett's esophagus. Hepatobiliary: Diffuse and fairly marked fatty infiltration of the liver. There are also changes of cirrhosis. No focal hepatic lesions or intrahepatic biliary dilatation. The gallbladder is contracted and contains gallstones. No common bile duct dilatation. Pancreas: No mass, inflammation or ductal dilatation. Small duodenum diverticulum noted near the pancreatic head. Spleen: Normal size.  No focal lesions. Adrenals/Urinary Tract: The adrenal glands and kidneys are unremarkable. Small renal cysts are noted. No renal, ureteral or bladder calculi or mass. Stomach/Bowel: The stomach and duodenum are unremarkable. The small bowel is grossly normal. The proximal small bowel is normal. There are no distal loops of small bowel which  demonstrate inflammatory changes with wall thickening and submucosal edema. This most notably involves the distal and terminal ileum. There are also inflammatory changes involving the ascending colon with colonic wall thickening and submucosal edema. There are also fibrofatty type changes involving the cecum which would suggest chronic changes of inflammatory bowel disease. No  mass lesions or obstructive findings. Scattered sigmoid diverticulosis and muscular wall thickening. The appendix is normal. Vascular/Lymphatic: Distal aortic and iliac artery calcifications, advanced for age. No aneurysm or dissection. The branch vessels are normal. The major venous structures are patent. Portal venous collaterals are noted. Possible early esophageal varices. Small scattered mesenteric and retroperitoneal lymph nodes but no mass or overt adenopathy. Reproductive: The prostate gland and seminal vesicles are unremarkable. Other: Small left inguinal hernia containing fat. No inguinal adenopathy. Musculoskeletal: No significant bony findings. Advanced degenerative disc disease noted at L5-S1. IMPRESSION: 1. Moderate distal esophageal wall thickening and a moderate sized hiatal hernia. Findings could be due to reflux esophagitis but esophagram or endoscopy may be helpful to exclude neoplasm or Barrett's esophagus. 2. Age advanced vascular calcifications including coronary artery calcifications. 3. Diffuse fatty infiltration of the liver and changes of cirrhosis without focal hepatic lesion. Portal venous hypertension but no splenomegaly or ascites. 4. Inflammatory bowel disease mainly involving segmental sections of the distal ileum and terminal ileum. There are also acute and chronic inflammatory changes involving the cecum and ascending colon. Does this patient have a history of Crohn's disease? 5. Contracted gallbladder with gallstones. Electronically Signed   By: Rudie Meyer M.D.   On: 06/15/2017 12:47     Scheduled  Meds: . [START ON 06/17/2017] enoxaparin (LOVENOX) injection  40 mg Subcutaneous Q24H  . folic acid  1 mg Oral Daily  . Influenza vac split quadrivalent PF  0.5 mL Intramuscular Tomorrow-1000  . LORazepam  0-4 mg Intravenous Q6H   Followed by  . [START ON 06/17/2017] LORazepam  0-4 mg Intravenous Q12H  . multivitamin with minerals  1 tablet Oral Daily  . pantoprazole (PROTONIX) IV  40 mg Intravenous Q12H  . pneumococcal 23 valent vaccine  0.5 mL Intramuscular Tomorrow-1000  . potassium chloride  40 mEq Oral Q2H  . thiamine  100 mg Oral Daily   Or  . thiamine  100 mg Intravenous Daily  . vancomycin  125 mg Oral Q6H   Continuous Infusions:   Pamella Pert, MD, PhD [facility] Pager [phone]  If 7PM-7AM, please contact night-coverage [email or web address] Password [email or web address] 06/16/2017, 11:52 AM

## 2017-06-17 ENCOUNTER — Telehealth: Payer: Self-pay

## 2017-06-17 ENCOUNTER — Encounter: Payer: Self-pay | Admitting: Nurse Practitioner

## 2017-06-17 ENCOUNTER — Encounter (HOSPITAL_COMMUNITY)
Admission: EM | Disposition: A | Discharge status: Home / Self Care | Payer: Self-pay | Source: Home / Self Care | Attending: Internal Medicine

## 2017-06-17 ENCOUNTER — Inpatient Hospital Stay (HOSPITAL_COMMUNITY): Payer: Self-pay | Admitting: Anesthesiology

## 2017-06-17 ENCOUNTER — Encounter (HOSPITAL_COMMUNITY): Payer: Self-pay

## 2017-06-17 DIAGNOSIS — K2970 Gastritis, unspecified, without bleeding: Secondary | ICD-10-CM

## 2017-06-17 DIAGNOSIS — K228 Other specified diseases of esophagus: Secondary | ICD-10-CM

## 2017-06-17 HISTORY — PX: ESOPHAGOGASTRODUODENOSCOPY (EGD) WITH PROPOFOL: SHX5813

## 2017-06-17 LAB — HEPATITIS PANEL, ACUTE
HEP A IGM: NEGATIVE
Hep B C IgM: NEGATIVE
Hepatitis B Surface Ag: NEGATIVE

## 2017-06-17 LAB — BASIC METABOLIC PANEL
ANION GAP: 10 (ref 5–15)
Anion gap: 7 (ref 5–15)
BUN: <5 mg/dL — ABNORMAL LOW (ref 6–20)
BUN: <5 mg/dL — ABNORMAL LOW (ref 6–20)
CALCIUM: 6.8 mg/dL — AB (ref 8.9–10.3)
CHLORIDE: 109 mmol/L (ref 101–111)
CO2: 25 mmol/L (ref 22–32)
CO2: 26 mmol/L (ref 22–32)
CREATININE: 1.04 mg/dL (ref 0.61–1.24)
Calcium: 6.9 mg/dL — ABNORMAL LOW (ref 8.9–10.3)
Chloride: 106 mmol/L (ref 101–111)
Creatinine, Ser: 1.01 mg/dL (ref 0.61–1.24)
GFR calc non Af Amer: >60 mL/min (ref >60)
Glucose, Bld: 88 mg/dL (ref 65–99)
Glucose, Bld: 89 mg/dL (ref 65–99)
POTASSIUM: 3.5 mmol/L (ref 3.5–5.1)
Potassium: 2.8 mmol/L — ABNORMAL LOW (ref 3.5–5.1)
SODIUM: 141 mmol/L (ref 135–145)
SODIUM: 142 mmol/L (ref 135–145)

## 2017-06-17 LAB — CBC
HCT: 27.8 % — ABNORMAL LOW (ref 39.0–52.0)
HEMOGLOBIN: 10.1 g/dL — AB (ref 13.0–17.0)
MCH: 37.8 pg — ABNORMAL HIGH (ref 26.0–34.0)
MCHC: 36.3 g/dL — ABNORMAL HIGH (ref 30.0–36.0)
MCV: 104.1 fL — ABNORMAL HIGH (ref 78.0–100.0)
Platelets: 106 10*3/uL — ABNORMAL LOW (ref 150–400)
RBC: 2.67 MIL/uL — AB (ref 4.22–5.81)
RDW: 13.4 % (ref 11.5–15.5)
WBC: 9 10*3/uL (ref 4.0–10.5)

## 2017-06-17 SURGERY — ESOPHAGOGASTRODUODENOSCOPY (EGD) WITH PROPOFOL
Anesthesia: Monitor Anesthesia Care

## 2017-06-17 MED ORDER — PROPOFOL 10 MG/ML IV BOLUS
INTRAVENOUS | Status: AC
  Filled 2017-06-17: qty 40

## 2017-06-17 MED ORDER — LACTATED RINGERS IV SOLN
INTRAVENOUS | Status: DC | PRN
  Administered 2017-06-17: 09:00:00 via INTRAVENOUS

## 2017-06-17 MED ORDER — FLUCONAZOLE 200 MG PO TABS
400.0000 mg | ORAL_TABLET | Freq: Once | ORAL | Status: AC
Start: 2017-06-17 — End: 2017-06-17
  Administered 2017-06-17: 400 mg via ORAL
  Filled 2017-06-17: qty 2
  Filled 2017-06-17: qty 4

## 2017-06-17 MED ORDER — PANTOPRAZOLE SODIUM 20 MG PO TBEC
20.0000 mg | DELAYED_RELEASE_TABLET | Freq: Every day | ORAL | Status: DC
  Administered 2017-06-17 – 2017-06-18 (×2): 20 mg via ORAL
  Filled 2017-06-17 (×2): qty 1

## 2017-06-17 MED ORDER — PROPOFOL 500 MG/50ML IV EMUL
INTRAVENOUS | Status: DC | PRN
  Administered 2017-06-17: 125 ug/kg/min via INTRAVENOUS

## 2017-06-17 MED ORDER — LIDOCAINE HCL (CARDIAC) 20 MG/ML IV SOLN
INTRAVENOUS | Status: DC | PRN
  Administered 2017-06-17: 100 mg via INTRATRACHEAL

## 2017-06-17 MED ORDER — PANTOPRAZOLE SODIUM 40 MG PO TBEC: 40.0000 mg | DELAYED_RELEASE_TABLET | Freq: Every day | ORAL | Status: DC

## 2017-06-17 MED ORDER — PROPOFOL 10 MG/ML IV BOLUS
INTRAVENOUS | Status: DC | PRN
  Administered 2017-06-17: 50 mg via INTRAVENOUS

## 2017-06-17 MED ORDER — POTASSIUM CHLORIDE CRYS ER 20 MEQ PO TBCR
40.0000 meq | EXTENDED_RELEASE_TABLET | ORAL | Status: AC
  Administered 2017-06-17 (×2): 40 meq via ORAL
  Filled 2017-06-17 (×2): qty 2

## 2017-06-17 MED ORDER — LIDOCAINE 2% (20 MG/ML) 5 ML SYRINGE
INTRAMUSCULAR | Status: AC
  Filled 2017-06-17: qty 5

## 2017-06-17 MED ORDER — FLUCONAZOLE 200 MG PO TABS
200.0000 mg | ORAL_TABLET | Freq: Every day | ORAL | Status: DC
  Administered 2017-06-18: 200 mg via ORAL
  Filled 2017-06-17: qty 2
  Filled 2017-06-17: qty 1

## 2017-06-17 SURGICAL SUPPLY — 15 items

## 2017-06-17 NOTE — Anesthesia Postprocedure Evaluation (Signed)
Anesthesia Post Note  Patient: Michael Reeves  Procedure(s) Performed: Procedure(s) (LRB): ESOPHAGOGASTRODUODENOSCOPY (EGD) WITH PROPOFOL (N/A)     Patient location during evaluation: PACU Anesthesia Type: MAC Level of consciousness: awake and alert Pain management: pain level controlled Vital Signs Assessment: post-procedure vital signs reviewed and stable Respiratory status: spontaneous breathing, nonlabored ventilation, respiratory function stable and patient connected to nasal cannula oxygen Cardiovascular status: stable and blood pressure returned to baseline Postop Assessment: no apparent nausea or vomiting Anesthetic complications: no    Last Vitals:  Vitals:   06/17/17 1010 06/17/17 1032  BP: 108/69 128/73  Pulse: 63 72  Resp: 17 18  Temp:  36.4 C  SpO2: 96% 100%    Last Pain:  Vitals:   06/17/17 1032  TempSrc: Oral  PainSc:                  [name] S

## 2017-06-17 NOTE — H&P (View-Only) (Signed)
Patient ID: Michael Reeves, male   DOB: [birth year], 46 y.o.   MRN: [id number]    Progress Note   Subjective  Feels pretty good- hungry , denies any abdominal pain-diarrhea less seems to be forming Denies jitters  K+still less than 2 today - EGD cancelled   Objective   Vital signs in last 24 hours: Temp:  [98.1 F (36.7 C)-98.4 F (36.9 C)] 98.4 F (36.9 C) (09/24 0351) Pulse Rate:  [54-81] 81 (09/24 0351) Resp:  [18-20] 18 (09/24 0351) BP: (103-122)/(68-93) 122/86 (09/24 0351) SpO2:  [98 %-100 %] 100 % (09/24 0351) Weight:  [181 lb 7 oz (82.3 kg)] 181 lb 7 oz (82.3 kg) (09/24 0351) Last BM Date: 06/15/17 General:    white male in NAD Heart:  Regular rate and rhythm; no murmurs Lungs: Respirations even and unlabored, lungs CTA bilaterally Abdomen:  Soft, nontender and nondistended. Normal bowel sounds. Extremities:  Without edema.,wearing ankle monitor Neurologic:  Alert and oriented,  grossly normal neurologically. Psych:  Cooperative. Normal mood and affect.  Intake/Output from previous day: 09/23 0701 - 09/24 0700 In: 1025 [I.V.:825; IV Piggyback:200] Out: 600 [Urine:600] Intake/Output this shift: No intake/output data recorded.  Lab Results:  Recent Labs  06/14/17 1825 06/15/17 0649 06/16/17 0450  WBC 14.9* 9.3 9.2  HGB 11.8* 10.3* 9.9*  HCT 32.1* 27.6* 26.9*  PLT 149* 106* 97*   BMET  Recent Labs  06/15/17 0649 06/15/17 1627 06/16/17 0450  NA 144 141 141  K <2.0* <2.0* <2.0*  CL 100* 101 103  CO2 34* 32 30  GLUCOSE 102* 102* 110*  BUN 6 6 <5*  CREATININE 1.18 1.13 1.07  CALCIUM 6.1* 6.3* 6.4*   LFT  Recent Labs  06/16/17 0450  PROT 5.3*  ALBUMIN 2.7*  AST 76*  ALT 24  ALKPHOS 73  BILITOT 1.6*   PT/INR  Recent Labs  06/15/17 0649  LABPROT 15.1  INR 1.20    Studies/Results: Dg Chest 2 View  Result Date: 06/14/2017 CLINICAL DATA:  Dehydrated, weak EXAM: CHEST  2 VIEW COMPARISON:  None. FINDINGS: The heart size and mediastinal  contours are within normal limits. Both lungs are clear. The visualized skeletal structures are unremarkable. IMPRESSION: No active cardiopulmonary disease. Electronically Signed   By: Jasmine Pang M.D.   On: 06/14/2017 22:38   Ct Abdomen Pelvis W Contrast  Result Date: 06/15/2017 CLINICAL DATA:  Nausea, vomiting and dysphagia. Upper abdominal pain. EXAM: CT ABDOMEN AND PELVIS WITH CONTRAST TECHNIQUE: Multidetector CT imaging of the abdomen and pelvis was performed using the standard protocol following bolus administration of intravenous contrast. CONTRAST:  30mL ISOVUE-300 IOPAMIDOL (ISOVUE-300) INJECTION 61%, ISOVUE-300 IOPAMIDOL (ISOVUE-300) INJECTION 61% COMPARISON:  None. FINDINGS: Lower chest: The lung bases are clear of acute process. No infiltrates, edema or effusions. No worrisome pulmonary lesions. The heart is normal in size. No pericardial effusion. Age advanced coronary artery calcifications are noted. Moderate distal esophageal wall thickening above a moderate-sized hiatal hernia. Findings could be due to reflux esophagitis. Esophagram or endoscopy may be helpful for further evaluation and to exclude neoplasm or Barrett's esophagus. Hepatobiliary: Diffuse and fairly marked fatty infiltration of the liver. There are also changes of cirrhosis. No focal hepatic lesions or intrahepatic biliary dilatation. The gallbladder is contracted and contains gallstones. No common bile duct dilatation. Pancreas: No mass, inflammation or ductal dilatation. Small duodenum diverticulum noted near the pancreatic head. Spleen: Normal size.  No focal lesions. Adrenals/Urinary Tract: The adrenal glands and kidneys are unremarkable. Small renal  cysts are noted. No renal, ureteral or bladder calculi or mass. Stomach/Bowel: The stomach and duodenum are unremarkable. The small bowel is grossly normal. The proximal small bowel is normal. There are no distal loops of small bowel which demonstrate inflammatory changes  with wall thickening and submucosal edema. This most notably involves the distal and terminal ileum. There are also inflammatory changes involving the ascending colon with colonic wall thickening and submucosal edema. There are also fibrofatty type changes involving the cecum which would suggest chronic changes of inflammatory bowel disease. No mass lesions or obstructive findings. Scattered sigmoid diverticulosis and muscular wall thickening. The appendix is normal. Vascular/Lymphatic: Distal aortic and iliac artery calcifications, advanced for age. No aneurysm or dissection. The branch vessels are normal. The major venous structures are patent. Portal venous collaterals are noted. Possible early esophageal varices. Small scattered mesenteric and retroperitoneal lymph nodes but no mass or overt adenopathy. Reproductive: The prostate gland and seminal vesicles are unremarkable. Other: Small left inguinal hernia containing fat. No inguinal adenopathy. Musculoskeletal: No significant bony findings. Advanced degenerative disc disease noted at L5-S1. IMPRESSION: 1. Moderate distal esophageal wall thickening and a moderate sized hiatal hernia. Findings could be due to reflux esophagitis but esophagram or endoscopy may be helpful to exclude neoplasm or Barrett's esophagus. 2. Age advanced vascular calcifications including coronary artery calcifications. 3. Diffuse fatty infiltration of the liver and changes of cirrhosis without focal hepatic lesion. Portal venous hypertension but no splenomegaly or ascites. 4. Inflammatory bowel disease mainly involving segmental sections of the distal ileum and terminal ileum. There are also acute and chronic inflammatory changes involving the cecum and ascending colon. Does this patient have a history of Crohn's disease? 5. Contracted gallbladder with gallstones. Electronically Signed   By: Rudie Meyer M.D.   On: 06/15/2017 12:47       Assessment / Plan:    #1  46 yo male   Admitted yesterday after a 5 day history of nausea vomiting and diarrhea. Patient has tested positive for C. difficile, and is now on vancomycin. Diarrhea improving #2 profound hypokalemia, and hypomagnesemia-may be multifactorial secondary to EtOH abuse and acute nausea vomiting diarrhea. Being replaced aggressively #3 dysphagia-acute just over this past week, suspect secondary to esophagitis, but CT did show distal esophageal thickening and therefore need to exclude neoplasm, stricture, Barrett's esophagus #4 colonic inflammatory changes on CT-likely secondary to C. difficile colitis #5 elevated LFTs-secondary to EtOH abuse #6 elevated troponins-cardiology is evaluating, he'll secondary to profound electrolyte disturbance  Plan; upper endoscopy canceled for today secondary to persistent profound hypokalemia We'll start full liquid diet, nothing by mouth after midnight and reschedule for EGD tomorrow Dr. Adela Lank. Hold Lovenox this evening in case esophageal dilation needed Continue vancomycin 125 mg by mouth 4 times a day 14 days total Await final cardiology recommendations, and echo   Discharge Planning    Contact  Amy Esterwood, P.A.-C               [phone]      Principal Problem:   Hypokalemia Active Problems:   Hypomagnesemia   Elevated troponin   Anemia   Thrombocytopenia (HCC)   Dysphagia   Diarrhea   Alcohol abuse     LOS: 2 days   Amy Esterwood  06/16/2017, 9:55 AM

## 2017-06-17 NOTE — Anesthesia Preprocedure Evaluation (Addendum)
Anesthesia Evaluation  Patient identified by MRN, date of birth, ID band Patient awake    Reviewed: Allergy & Precautions, NPO status , Patient's Chart, lab work & pertinent test results  Airway Mallampati: II  TM Distance: >3 FB Neck ROM: Full    Dental no notable dental hx.    Pulmonary Current Smoker,    breath sounds clear to auscultation + wheezing      Cardiovascular Normal cardiovascular exam Rhythm:Regular Rate:Normal  Prolonged QT   Neuro/Psych negative neurological ROS  negative psych ROS   GI/Hepatic (+) Cirrhosis     substance abuse  alcohol use, hypokalemia   Endo/Other  negative endocrine ROS  Renal/GU negative Renal ROS  negative genitourinary   Musculoskeletal negative musculoskeletal ROS (+)   Abdominal   Peds negative pediatric ROS (+)  Hematology negative hematology ROS (+)   Anesthesia Other Findings   Reproductive/Obstetrics negative OB ROS                            Anesthesia Physical Anesthesia Plan  ASA: III  Anesthesia Plan: MAC   Post-op Pain Management:    Induction: Intravenous  PONV Risk Score and Plan: 0  Airway Management Planned: Simple Face Mask  Additional Equipment:   Intra-op Plan:   Post-operative Plan:   Informed Consent: I have reviewed the patients History and Physical, chart, labs and discussed the procedure including the risks, benefits and alternatives for the proposed anesthesia with the patient or authorized representative who has indicated his/her understanding and acceptance.   Dental advisory given  Plan Discussed with: CRNA and Surgeon  Anesthesia Plan Comments:         Anesthesia Quick Evaluation

## 2017-06-17 NOTE — Progress Notes (Signed)
PROGRESS NOTE  Michael Reeves UEK:[id number] DOB: April 22, 1971 DOA: 06/14/2017 PCP: Patient, No Pcp Per   LOS: 3 days   Brief Narrative / Interim history: 46 y.o. male, w etoh dep, who apparently drinks about 3/4 5th of liquor per day presents with c/o n/v, and dysphagia to solids/liquids since Monday as well as diarrhea 1-4x per day since Tuesday. He was diagnosed with C diff and started on po Vancomycin with improvement in his watery stools. GI consulted and patient had an EGD 9/25.  Assessment & Plan: Principal Problem:   Hypokalemia Active Problems:   Hypomagnesemia   Elevated troponin   Anemia   Thrombocytopenia (HCC)   Dysphagia   Diarrhea   Alcohol abuse   Enteritis due to Clostridium difficile   Alcoholic cirrhosis of liver without ascites (HCC)   Diarrhea due to C. difficile colitis, nausea vomiting -GI pathogen panel negative -He tested positive for C. difficile, started on p.o. vancomycin on 9/23.  Stools are beginning to form more today and close to normal consistency  -CT scan of the abdomen and pelvis with evidence of segmental resection of the distal ileum, terminal ileum as well as the cecum and ascending colon with inflammatory changes.  GI following.  Dysphagia -acute, over the last week, has difficulties eating solid and can do sips of liquids but if he does larger volume of liquids he will throw it back up.  GI consulted, appreciate input.  -he underwent an EGD today which showed concern for esophageal candidiasis, and recommended fluconazole.  Patient is HIV negative. -Cardiology recommends to hold on the fluconazole until potassium is normalized and QTc is better.  Will repeat an EKG in the morning  Lipase elevation -CT scan without significant pancreatic inflammation  Fatty liver/changes of cirrhosis/portal hypertension -Found that the CT scan, discussed with the patient, he will need to quit alcohol completely  Abnormal LFTs -In a pattern consistent with  alcohol abuse, improving  Profound hypokalemia/hypomagnesemia -Quite critical, replete potassium aggressively, this is likely due to poor p.o. intake and GI losses as well as ongoing alcoholism -Potassium slowly improving, replete again this afternoon in IVF and recheck in the morning  Troponin elevation -2D echo without major findings -EKG was noted with diffuse ST depression   DVT prophylaxis: lovenox Code Status: Full code Family Communication: No family at bedside Disposition Plan: home in the morning if potassium and magnesium are within parameters  Consultants:   Cardiology  GI  Procedures:   EGD 9/25 Impression:               - Esophagogastric landmarks identified.                           - 4 cm hiatal hernia.                           - Multiple white plaques in the upper third of the                            esophagus, concerning for esophageal candidiasis.                            Brushings performed.                           -  Gastritis. Biopsied.                           - Normal duodenal bulb and second portion of the                            duodenum. Moderate Sedation:      No moderate sedation, case performed with MAC Recommendation:           - Return patient to hospital ward for ongoing care.                           - Regular diet                           - Continue present medications.                           - Await pathology results.                           - Start fluconazole 429m x 1 now, then 2022mdaily                            for 2 weeks (unless pathology returns negative for                            candidiasis)                           - GI service will sign off for now, he can follow                            up with me as an outpatient for long term                            management. We will coordinate.   2D echo Study Conclusions - Left ventricle: The cavity size was normal. Wall thickness was increased in a  pattern of mild LVH. Systolic function was normal. The estimated ejection fraction was in the range of 60% to 65%.  Antimicrobials:  None   Subjective: - no chest pain, shortness of breath, no abdominal pain, nausea or vomiting.   Objective: Vitals:   06/17/17 1005 06/17/17 1010 06/17/17 1032 06/17/17 1534  BP: 96/66 108/69 128/73 (!) 117/95  Pulse: 66 63 72 67  Resp: _0 Temp: (!) 97.5 F (36.4 C)  97.6 F (36.4 C) 97.7 F (36.5 C)  TempSrc: Oral  Oral Oral  SpO2: 100% 96% 100% 100%  Weight:      Height:        Intake/Output Summary (Last 24 hours) at 06/17/17 1617 Last data filed at 06/17/17 1535  Gross per 24 hour  Intake          2531.25 ml  Output                0 ml  Net          2531.25 ml   Filed Weights   06/16/17 0351  06/17/17 0614 06/17/17 0843  Weight: 82.3 kg (181 lb 7 oz) 86.3 kg (190 lb 4.8 oz) 82.1 kg (181 lb)    Examination:  Constitutional: NAD, calm, comfortable Eyes: lids and conjunctivae normal ENMT: Mucous membranes are moist.  Neck: normal, supple Respiratory: clear to auscultation bilaterally, no wheezing, no crackles.  Cardiovascular: Regular rate and rhythm, no murmurs / rubs / gallops.  Abdomen: no tenderness. Bowel sounds positive.  Skin: no rashes, lesions, ulcers. No induration Neurologic: non focal    Data Reviewed: I have independently reviewed following labs and imaging studies   CBC:  Recent Labs Lab 06/14/17 1825 06/15/17 0102 06/15/17 0649 06/16/17 0450 06/17/17 0440  WBC 14.9*  --  9.3 9.2 9.0  HGB 11.8*  --  10.3* 9.9* 10.1*  HCT 32.1* 30.3* 27.6* 26.9* 27.8*  MCV 97.9  --  100.4* 98.9 104.1*  PLT 149*  --  106* 97* 751*   Basic Metabolic Panel:  Recent Labs Lab 06/14/17 2054 06/15/17 0649 06/15/17 1627 06/16/17 0450 06/16/17 1554 06/17/17 0440 06/17/17 1416  NA  --  144 141 141 141 141 142  K  --  <2.0* <2.0* <2.0* 2.4* 2.8* 3.5  CL  --  100* 101 103 106 106 109  CO2  --  34* 32 _0 GLUCOSE  --  102* 102* 110* 85 88 89  BUN  --  6 6 <5* <5* <5* <5*  CREATININE  --  1.18 1.13 1.07 1.01 1.01 1.04  CALCIUM  --  6.1* 6.3* 6.4* 6.5* 6.8* 6.9*  MG 1.0* 1.5*  --  1.9  --   --   --   PHOS 3.0  --   --   --   --   --   --    GFR: Estimated Creatinine Clearance: 90.7 mL/min (by C-G formula based on SCr of 1.04 mg/dL). Liver Function Tests:  Recent Labs Lab 06/14/17 1942 06/15/17 0649 06/16/17 0450  AST 104* 82* 76*  ALT _1 ALKPHOS 82 64 73  BILITOT 2.1* 1.6* 1.6*  PROT 6.8 5.4* 5.3*  ALBUMIN 3.3* 2.6* 2.7*    Recent Labs Lab 06/14/17 1942 06/15/17 0649  LIPASE 84* 108*   No results for input(s): AMMONIA in the last 168 hours. Coagulation Profile:  Recent Labs Lab 06/15/17 0649  INR 1.20   Cardiac Enzymes:  Recent Labs Lab 06/14/17 2103 06/15/17 0102 06/15/17 0649 06/15/17 1147  CKTOTAL  --  5,965*  --   --   CKMB  --  5.0  --   --   TROPONINI 0.10* 0.09* 0.07* 0.07*   BNP (last 3 results) No results for input(s): PROBNP in the last 8760 hours. HbA1C: No results for input(s): HGBA1C in the last 72 hours. CBG: No results for input(s): GLUCAP in the last 168 hours. Lipid Profile: No results for input(s): CHOL, HDL, LDLCALC, TRIG, CHOLHDL, LDLDIRECT in the last 72 hours. Thyroid Function Tests:  Recent Labs  06/15/17 0102  TSH 2.020   Anemia Panel:  Recent Labs  06/15/17 0102  VITAMINB12 587  FERRITIN 332  TIBC 252  IRON 31*   Urine analysis:    Component Value Date/Time   COLORURINE YELLOW 06/14/2017 2239   APPEARANCEUR CLEAR 06/14/2017 2239   LABSPEC 1.004 (L) 06/14/2017 2239   PHURINE 7.0 06/14/2017 2239   GLUCOSEU NEGATIVE 06/14/2017 2239   HGBUR MODERATE (A) 06/14/2017 2239   BILIRUBINUR NEGATIVE 06/14/2017 2239   KETONESUR NEGATIVE 06/14/2017 2239  PROTEINUR NEGATIVE 06/14/2017 2239   NITRITE NEGATIVE 06/14/2017 2239   LEUKOCYTESUR NEGATIVE 06/14/2017 2239   Sepsis Labs: Invalid input(s):  PROCALCITONIN, LACTICIDVEN  Recent Results (from the past 240 hour(s))  Gastrointestinal Panel by PCR , Stool     Status: None   Collection Time: 06/15/17 12:44 AM  Result Value Ref Range Status   Campylobacter species NOT DETECTED NOT DETECTED Final   Plesimonas shigelloides NOT DETECTED NOT DETECTED Final   Salmonella species NOT DETECTED NOT DETECTED Final   Yersinia enterocolitica NOT DETECTED NOT DETECTED Final   Vibrio species NOT DETECTED NOT DETECTED Final   Vibrio cholerae NOT DETECTED NOT DETECTED Final   Enteroaggregative E coli (EAEC) NOT DETECTED NOT DETECTED Final   Enteropathogenic E coli (EPEC) NOT DETECTED NOT DETECTED Final   Enterotoxigenic E coli (ETEC) NOT DETECTED NOT DETECTED Final   Shiga like toxin producing E coli (STEC) NOT DETECTED NOT DETECTED Final   Shigella/Enteroinvasive E coli (EIEC) NOT DETECTED NOT DETECTED Final   Cryptosporidium NOT DETECTED NOT DETECTED Final   Cyclospora cayetanensis NOT DETECTED NOT DETECTED Final   Entamoeba histolytica NOT DETECTED NOT DETECTED Final   Giardia lamblia NOT DETECTED NOT DETECTED Final   Adenovirus F40/41 NOT DETECTED NOT DETECTED Final   Astrovirus NOT DETECTED NOT DETECTED Final   Norovirus GI/GII NOT DETECTED NOT DETECTED Final   Rotavirus A NOT DETECTED NOT DETECTED Final   Sapovirus (I, II, IV, and V) NOT DETECTED NOT DETECTED Final  C difficile quick scan w PCR reflex     Status: Abnormal   Collection Time: 06/15/17 12:44 AM  Result Value Ref Range Status   C Diff antigen NEGATIVE NEGATIVE Final   C Diff toxin POSITIVE (A) NEGATIVE Final    Comment: CRITICAL RESULT CALLED TO, READ BACK BY AND VERIFIED WITH: DARK,T RN [id number] [place],N    C Diff interpretation Results are indeterminate. See PCR results.  Final  Clostridium Difficile by PCR     Status: None   Collection Time: 06/15/17 12:44 AM  Result Value Ref Range Status   Toxigenic C Difficile by pcr NEGATIVE NEGATIVE Final    Comment:  Patient is colonized with non toxigenic C. difficile. May not need treatment unless significant symptoms are present. Performed at [facility], [place] [address], [place], [place] [address]       Radiology Studies: No results found.   Scheduled Meds: . enoxaparin (LOVENOX) injection  40 mg Subcutaneous Q24H  . [START ON 06/18/2017] fluconazole  200 mg Oral Daily  . folic acid  1 mg Oral Daily  . Influenza vac split quadrivalent PF  0.5 mL Intramuscular Tomorrow-1000  . LORazepam  0-4 mg Intravenous Q12H  . multivitamin with minerals  1 tablet Oral Daily  . pantoprazole  20 mg Oral Daily  . pneumococcal 23 valent vaccine  0.5 mL Intramuscular Tomorrow-1000  . thiamine  100 mg Oral Daily  . vancomycin  125 mg Oral Q6H   Continuous Infusions: . 0.9 % NaCl with KCl 40 mEq / L 75 mL/hr (06/17/17 0654)    Marzetta Board, MD, PhD [facility] Pager [phone]  If 7PM-7AM, please contact night-coverage [email or web address] Password [email or web address] 06/17/2017, 4:17 PM

## 2017-06-17 NOTE — Telephone Encounter (Signed)
Message routed to our schedulers, asked that they do this so appointment information will print out on discharge instructions.

## 2017-06-17 NOTE — Op Note (Signed)
[facility] Patient Name: Michael Reeves Procedure Date: 06/17/2017 MRN: [id number] Attending MD: Michael Raspberry. [name] MD, MD Date of Birth: [birth year] CSN: [id number] Age: 46 Admit Type: Inpatient Procedure:                Upper GI endoscopy Indications:              Dysphagia, suspected cirrhosis Providers:                Remo Lipps P. Aradhya Shellenbarger MD, MD, Elmer Ramp. Tilden Dome, RN,                            Tinnie Gens, Technician, Stephanie [place] CRNA Referring MD:              Medicines:                Monitored Anesthesia Care Complications:            No immediate complications. Estimated blood loss:                            Minimal. Estimated Blood Loss:     Estimated blood loss was minimal. Procedure:                Pre-Anesthesia Assessment:                           - Prior to the procedure, a History and Physical                            was performed, and patient medications and                            allergies were reviewed. The patient's tolerance of                            previous anesthesia was also reviewed. The risks                            and benefits of the procedure and the sedation                            options and risks were discussed with the patient.                            All questions were answered, and informed consent                            was obtained. Prior Anticoagulants: The patient has                            taken no previous anticoagulant or antiplatelet                            agents. ASA Grade Assessment: III - A patient with  severe systemic disease. After reviewing the risks                            and benefits, the patient was deemed in                            satisfactory condition to undergo the procedure.                           After obtaining informed consent, the endoscope was                            passed under direct vision. Throughout the                             procedure, the patient's blood pressure, pulse, and                            oxygen saturations were monitored continuously. The                            [id number]-[id number] ([id number]) scope was introduced through the                            mouth, and advanced to the second part of duodenum.                            The upper GI endoscopy was accomplished without                            difficulty. The patient tolerated the procedure                            well. Scope In: Scope Out: Findings:      Esophagogastric landmarks were identified: the Z-line was found at 40       cm, the gastroesophageal junction was found at 40 cm and the upper       extent of the gastric folds was found at 44 cm from the incisors.      A 4 cm hiatal hernia was present.      Multiple small white plaques were found in the upper third of the       esophagus concerning for esophageal candidiasis. Brushings were obtained       in the upper third of the esophagus.      The exam of the esophagus was otherwise normal. No esophageal varices       noted.      Patchy mild inflammation characterized by erosions and erythema was       found in the gastric antrum. Biopsies were taken with a cold forceps for       Helicobacter pylori testing.      The exam of the stomach was otherwise normal.      The duodenal bulb and second portion of the duodenum were normal. Impression:               - Esophagogastric landmarks identified.                           -  4 cm hiatal hernia.                           - Multiple white plaques in the upper third of the                            esophagus, concerning for esophageal candidiasis.                            Brushings performed.                           - Gastritis. Biopsied.                           - Normal duodenal bulb and second portion of the                            duodenum. Moderate Sedation:      No moderate sedation, case performed with  MAC Recommendation:           - Return patient to hospital ward for ongoing care.                           - Regular diet                           - Continue present medications.                           - Await pathology results.                           - Start fluconazole 481m x 1 now, then 2024mdaily                            for 2 weeks (unless pathology returns negative for                            candidiasis)                           - GI service will sign off for now, he can follow                            up with me as an outpatient for long term                            management. We will coordinate. Procedure Code(s):        --- Professional ---                           [id number]py, flexible,                            transoral; with biopsy, single or multiple Diagnosis Code(s):        ---  Professional ---                           K44.9, Diaphragmatic hernia without obstruction or                            gangrene                           K22.8, Other specified diseases of esophagus                           K29.70, Gastritis, unspecified, without bleeding                           R13.10, Dysphagia, unspecified CPT copyright 2016 American Medical Association. All rights reserved. The codes documented in this report are preliminary and upon coder review may  be revised to meet current compliance requirements. Remo Lipps P. Laylana Gerwig MD, MD 06/17/2017 10:01:45 AM This report has been signed electronically. Number of Addenda: 0

## 2017-06-17 NOTE — Telephone Encounter (Signed)
-----   Message from Benancio Deeds, MD sent at 06/17/2017 10:05 AM EDT ----- Regarding: follow up Raynelle Fanning this patient will be discharged in the next day or so from the hospital. 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 Thanks

## 2017-06-17 NOTE — Transfer of Care (Signed)
Immediate Anesthesia Transfer of Care Note  Patient: Michael Reeves  Procedure(s) Performed: Procedure(s) with comments: ESOPHAGOGASTRODUODENOSCOPY (EGD) WITH PROPOFOL (N/A) - possible dilation  Patient Location: PACU and Endoscopy Unit  Anesthesia Type:MAC  Level of Consciousness: awake, alert  and oriented  Airway & Oxygen Therapy: Patient Spontanous Breathing  Post-op Assessment: Report given to RN and Post -op Vital signs reviewed and stable  Post vital signs: Reviewed and stable  Last Vitals:  Vitals:   06/17/17 0545 06/17/17 0843  BP: 122/82 108/78  Pulse: 74 (!) 59  Resp: 18 16  Temp: 36.4 C (!) 36.4 C  SpO2: 100% 100%    Last Pain:  Vitals:   06/17/17 0843  TempSrc: Oral  PainSc: 0-No pain      Patients Stated Pain Goal: 3 (24/23/53 6144)  Complications: No apparent anesthesia complications

## 2017-06-17 NOTE — Interval H&P Note (Signed)
History and Physical Interval Note:  06/17/2017 9:33 AM  [name]  has presented today for surgery, with the diagnosis of dysphagia  The various methods of treatment have been discussed with the patient and family. After consideration of risks, benefits and other options for treatment, the patient has consented to  Procedure(s) with comments: ESOPHAGOGASTRODUODENOSCOPY (EGD) WITH PROPOFOL (N/A) - possible dilation as a surgical intervention .  The patient's history has been reviewed, patient examined, no change in status, stable for surgery.  I have reviewed the patient's chart and labs.  Questions were answered to the patient's satisfaction.     Michael Reeves

## 2017-06-17 NOTE — Progress Notes (Deleted)
Progress Note  Patient Name: Michael Reeves Date of Encounter: 06/17/2017  Primary Cardiologist: New Dr. Elease Hashimoto   Subjective   No complaints, no chest pain and no SOB. Diarrhea has improved  Inpatient Medications    Scheduled Meds: . enoxaparin (LOVENOX) injection  40 mg Subcutaneous Q24H  . [START ON 06/18/2017] fluconazole  200 mg Oral Daily  . fluconazole  400 mg Oral Once  . folic acid  1 mg Oral Daily  . Influenza vac split quadrivalent PF  0.5 mL Intramuscular Tomorrow-1000  . LORazepam  0-4 mg Intravenous Q12H  . multivitamin with minerals  1 tablet Oral Daily  . pantoprazole  20 mg Oral Daily  . pneumococcal 23 valent vaccine  0.5 mL Intramuscular Tomorrow-1000  . potassium chloride  40 mEq Oral Q2H  . thiamine  100 mg Oral Daily  . vancomycin  125 mg Oral Q6H   Continuous Infusions: . 0.9 % NaCl with KCl 40 mEq / L 75 mL/hr (06/17/17 0654)   PRN Meds: acetaminophen, HYDROcodone-acetaminophen, LORazepam **OR** LORazepam   Vital Signs    Vitals:   06/17/17 0843 06/17/17 1005 06/17/17 1010 06/17/17 1032  BP: 108/78 96/66 108/69 128/73  Pulse: (!) 59 66 63 72  Resp: Temp: (!) 97.5 F (36.4 C) (!) 97.5 F (36.4 C)  97.6 F (36.4 C)  TempSrc: Oral Oral  Oral  SpO2: 100% 100% 96% 100%  Weight: 181 lb (82.1 kg)     Height:  (1.778 m)       Intake/Output Summary (Last 24 hours) at 06/17/17 1110 Last data filed at 06/17/17 1005  Gross per 24 hour  Intake           1367.5 ml  Output                0 ml  Net           1367.5 ml   Filed Weights   06/16/17 0351 06/17/17 0614 06/17/17 0843  Weight: 181 lb 7 oz (82.3 kg) 190 lb 4.8 oz (86.3 kg) 181 lb (82.1 kg)    Telemetry    Not on tele  - Personally Reviewed  ECG     I personally Reviewed SR with Qtc 497 improved  From 637 on admit   Physical Exam   GEN: No acute distress.   Neck: No JVD Cardiac: RRR, no murmurs, rubs, or gallops.  Respiratory: Clear to auscultation  bilaterally. GI: Soft, nontender, non-distended  MS: No edema; No deformity. Neuro:  Nonfocal  Psych: Normal affect   Labs    Chemistry Recent Labs Lab 06/14/17 1942 06/15/17 0649  06/16/17 0450 06/16/17 1554 06/17/17 0440  NA 136 144  < > 141 141 141  K <2.0* <2.0*  < > <2.0* 2.4* 2.8*  CL 86* 100*  < > 103 106 106  CO2 36* 34*  < > GLUCOSE 98 102*  < > 110* 85 88  BUN 7 6  < > <5* <5* <5*  CREATININE 1.27* 1.18  < > 1.07 1.01 1.01  CALCIUM 6.9* 6.1*  < > 6.4* 6.5* 6.8*  PROT 6.8 5.4*  --  5.3*  --   --   ALBUMIN 3.3* 2.6*  --  2.7*  --   --   AST 104* 82*  --  76*  --   --   ALT 28 24  --  24  --   --  ALKPHOS 82 64  --  73  --   --   BILITOT 2.1* 1.6*  --  1.6*  --   --   GFRNONAA >60 >60  < > >60 >60 >60  GFRAA >60 >60  < > >60 >60 >60  ANIONGAP 14 10  < > < > = values in this interval not displayed.   Hematology  Recent Labs Lab 06/15/17 0649 06/16/17 0450 06/17/17 0440  WBC 9.3 9.2 9.0  RBC 2.75* 2.72* 2.67*  HGB 10.3* 9.9* 10.1*  HCT 27.6* 26.9* 27.8*  MCV 100.4* 98.9 104.1*  MCH 37.5* 36.4* 37.8*  MCHC 37.3* 36.8* 36.3*  RDW 13.5 13.3 13.4  PLT 106* 97* 106*    Cardiac Enzymes  Recent Labs Lab 06/14/17 2103 06/15/17 0102 06/15/17 0649 06/15/17 1147  TROPONINI 0.10* 0.09* 0.07* 0.07*   No results for input(s): TROPIPOC in the last 168 hours.   BNPNo results for input(s): BNP, PROBNP in the last 168 hours.   DDimer No results for input(s): DDIMER in the last 168 hours.   Radiology    Ct Abdomen Pelvis W Contrast  Result Date: 06/15/2017 CLINICAL DATA:  Nausea, vomiting and dysphagia. Upper abdominal pain. EXAM: CT ABDOMEN AND PELVIS WITH CONTRAST TECHNIQUE: Multidetector CT imaging of the abdomen and pelvis was performed using the standard protocol following bolus administration of intravenous contrast. CONTRAST:  30mL ISOVUE-300 IOPAMIDOL (ISOVUE-300) INJECTION 61%, ISOVUE-300 IOPAMIDOL (ISOVUE-300) INJECTION 61%  COMPARISON:  None. FINDINGS: Lower chest: The lung bases are clear of acute process. No infiltrates, edema or effusions. No worrisome pulmonary lesions. The heart is normal in size. No pericardial effusion. Age advanced coronary artery calcifications are noted. Moderate distal esophageal wall thickening above a moderate-sized hiatal hernia. Findings could be due to reflux esophagitis. Esophagram or endoscopy may be helpful for further evaluation and to exclude neoplasm or Barrett's esophagus. Hepatobiliary: Diffuse and fairly marked fatty infiltration of the liver. There are also changes of cirrhosis. No focal hepatic lesions or intrahepatic biliary dilatation. The gallbladder is contracted and contains gallstones. No common bile duct dilatation. Pancreas: No mass, inflammation or ductal dilatation. Small duodenum diverticulum noted near the pancreatic head. Spleen: Normal size.  No focal lesions. Adrenals/Urinary Tract: The adrenal glands and kidneys are unremarkable. Small renal cysts are noted. No renal, ureteral or bladder calculi or mass. Stomach/Bowel: The stomach and duodenum are unremarkable. The small bowel is grossly normal. The proximal small bowel is normal. There are no distal loops of small bowel which demonstrate inflammatory changes with wall thickening and submucosal edema. This most notably involves the distal and terminal ileum. There are also inflammatory changes involving the ascending colon with colonic wall thickening and submucosal edema. There are also fibrofatty type changes involving the cecum which would suggest chronic changes of inflammatory bowel disease. No mass lesions or obstructive findings. Scattered sigmoid diverticulosis and muscular wall thickening. The appendix is normal. Vascular/Lymphatic: Distal aortic and iliac artery calcifications, advanced for age. No aneurysm or dissection. The branch vessels are normal. The major venous structures are patent. Portal venous collaterals  are noted. Possible early esophageal varices. Small scattered mesenteric and retroperitoneal lymph nodes but no mass or overt adenopathy. Reproductive: The prostate gland and seminal vesicles are unremarkable. Other: Small left inguinal hernia containing fat. No inguinal adenopathy. Musculoskeletal: No significant bony findings. Advanced degenerative disc disease noted at L5-S1. IMPRESSION: 1. Moderate distal esophageal wall thickening and a moderate sized hiatal hernia. Findings could be due to  reflux esophagitis but esophagram or endoscopy may be helpful to exclude neoplasm or Barrett's esophagus. 2. Age advanced vascular calcifications including coronary artery calcifications. 3. Diffuse fatty infiltration of the liver and changes of cirrhosis without focal hepatic lesion. Portal venous hypertension but no splenomegaly or ascites. 4. Inflammatory bowel disease mainly involving segmental sections of the distal ileum and terminal ileum. There are also acute and chronic inflammatory changes involving the cecum and ascending colon. Does this patient have a history of Crohn's disease? 5. Contracted gallbladder with gallstones. Electronically Signed   By: Rudie Meyer M.D.   On: 06/15/2017 12:47    Cardiac Studies   ECHO 06/15/17 Study Conclusions  - Left ventricle: The cavity size was normal. Wall thickness was increased in a pattern of mild LVH. Systolic function was normal. The estimated ejection fraction was in the range of 60% to 65%.   Patient Profile     46 y.o. male with a hx of Alcohol abusewho was admitted 06/14/17 for N/V/dysphasia and diarrhea.  + Cdiff.  Chronic Heartburn.  K+ 1.2 on admit and Mg+ 1.0 along with abnormal EKG and elevated troponin level  Assessment & Plan    Elevated troponin levels and abnormal EKG-with ST depression and QTc prolongation, most likely due to profound electrolyte disturbance.  Denied angina. --troponin 0.10; 0.09; 0.07; 0.07  --Echo with normal EF  60-65% mild LVH --repeated  06/16/17  EKG -ST depression improved prolonged QTc persists but improved will now 497  --+ FH CAD with father, may need eval as outpt. Will follow up with Dr. Melburn Popper  Hypokalemia  now up to 2.8    hypo magnesium now to 1.9 up from 1.0  + c- diff on Vanc per Im GI following with dysphagia as well. EGD with 4 cm HH, gastritis, biopsied, white plaques in upper third of esophagus possible candidiasis. Fluconazole started.  Should hold until K+ improved and Qtc improved.    AKI on admit now Cr 1.04  thrombocytopenia with plts 97-106   For questions or updates, please contact [facility] Please consult [email or web address] for contact info under Cardiology/STEMI.      Signed, Nada Boozer, NP  06/17/2017, 11:10 AM

## 2017-06-18 ENCOUNTER — Encounter (HOSPITAL_COMMUNITY): Payer: Self-pay | Admitting: Gastroenterology

## 2017-06-18 LAB — MAGNESIUM: MAGNESIUM: 1.1 mg/dL — AB (ref 1.7–2.4)

## 2017-06-18 LAB — BASIC METABOLIC PANEL
ANION GAP: 7 (ref 5–15)
BUN: 6 mg/dL (ref 6–20)
CHLORIDE: 105 mmol/L (ref 101–111)
CO2: 27 mmol/L (ref 22–32)
Calcium: 7.3 mg/dL — ABNORMAL LOW (ref 8.9–10.3)
Creatinine, Ser: 1.02 mg/dL (ref 0.61–1.24)
GFR calc Af Amer: >60 mL/min (ref >60)
Glucose, Bld: 94 mg/dL (ref 65–99)
POTASSIUM: 3.1 mmol/L — AB (ref 3.5–5.1)
SODIUM: 139 mmol/L (ref 135–145)

## 2017-06-18 LAB — HEPATITIS B SURFACE ANTIGEN: HEP B S AG: NEGATIVE

## 2017-06-18 MED ORDER — VANCOMYCIN 50 MG/ML ORAL SOLUTION: 125.0000 mg | Freq: Four times a day (QID) | ORAL | 0 refills | Status: AC

## 2017-06-18 MED ORDER — POTASSIUM CHLORIDE CRYS ER 20 MEQ PO TBCR
40.0000 meq | EXTENDED_RELEASE_TABLET | Freq: Once | ORAL | Status: AC
  Administered 2017-06-18: 40 meq via ORAL
  Filled 2017-06-18: qty 2

## 2017-06-18 MED ORDER — FOLIC ACID 1 MG PO TABS: 1.0000 mg | ORAL_TABLET | Freq: Every day | ORAL | 0 refills | Status: AC

## 2017-06-18 MED ORDER — POTASSIUM CHLORIDE ER 10 MEQ PO TBCR: 10.0000 meq | EXTENDED_RELEASE_TABLET | Freq: Every day | ORAL | 0 refills | Status: AC

## 2017-06-18 MED ORDER — FAMOTIDINE 10 MG PO TABS: 10.0000 mg | ORAL_TABLET | Freq: Two times a day (BID) | ORAL | 0 refills | Status: AC

## 2017-06-18 MED ORDER — MAGNESIUM SULFATE 2 GM/50ML IV SOLN
2.0000 g | Freq: Once | INTRAVENOUS | Status: AC
  Administered 2017-06-18: 2 g via INTRAVENOUS
  Filled 2017-06-18 (×2): qty 50

## 2017-06-18 MED ORDER — FLUCONAZOLE 200 MG PO TABS: 200.0000 mg | ORAL_TABLET | Freq: Every day | ORAL | 0 refills | Status: AC

## 2017-06-18 NOTE — Discharge Summary (Signed)
Physician Discharge Summary  Michael Reeves MWN:[id number] DOB: 15-Jan-1971 DOA: 06/14/2017  PCP: Patient, No Pcp Per  Admit date: 06/14/2017 Discharge date: 06/18/2017  Recommendations for Outpatient Follow-up:  Continue fluconazole fr 2 weeks on discharge Follow up with GI on outpatient basis Continue vanco by mouth through 10/7 Prescribed Pepcid instead of protonix due to C.diff Also, potassium prescribed for 5 days on discharge Mag sulfate given prior to discharge for low magnesium level this am  Discharge Diagnoses:  Principal Problem:   Hypokalemia Active Problems:   Hypomagnesemia   Elevated troponin   Anemia   Thrombocytopenia (HCC)   Dysphagia   Diarrhea   Alcohol abuse   Enteritis due to Clostridium difficile   Alcoholic cirrhosis of liver without ascites (HCC)   Discharge Condition: stable   Diet recommendation: as tolerated   History of present illness:   Per brief narrative 9/25 "45 y.o.male,w etoh dep, who apparently drinks about 3/4 5th of liquor per day presents with c/o n/v, and dysphagia to solids/liquids since Monday as well as diarrhea 1-4x per day since Tuesday. He was diagnosed with C diff and started on po Vancomycin with improvement in his watery stools. GI consulted and patient had an EGD 9/25."  Diarrhea due to C. difficile colitis, nausea vomiting - GI pathogen panel negative  - Positive for C.diff - On PO vanco through 10/7, total of 2 week treatment - Avoid PPI therapy so protonix stopped and will use Pepcid  - CT scan of the abdomen and pelvis with evidence of segmental resection of the distal ileum, terminal ileum as well as the cecum and ascending colon with inflammatory changes  Dysphagia - Underwent EGD 9/25 which showed possible esophageal candidiasis - HIV is negative - On Fluconazole, will continue for 2 weeks on discharge  - Potassium supplemented and magnesium given as well  Lipase elevation - CT without evidence of  pancreatitis   Fatty liver/changes of cirrhosis/portal hypertension - Supportive care   Abnormal LFTs - Likely alcoholic hepatitis   Profound hypokalemia/hypomagnesemia - Due to GI losses - Supplemented  Troponin elevation - Normal EF on 2 D ECHO - Outpt follow up with cardio    DVT prophylaxis: Lovenox subQ Code Status: Full code Family Communication: Family not at the bedside    Consultants:   Cardiology  GI  Procedures:   EGD 9/25 Impression: - Esophagogastric landmarks identified. - 4 cm hiatal hernia. - Multiple white plaques in the upper third of the  esophagus, concerning for esophageal candidiasis.  Brushings performed. - Gastritis. Biopsied. - Normal duodenal bulb and second portion of the  duodenum. Recommendation: - Return patient to hospital ward for ongoing care. - Regular diet - Continue present medications. - Await pathology results. - Start fluconazole  x 1 now, then  daily  for 2 weeks (unless pathology returns negative for  candidiasis) - GI service will sign off for now, he can follow  up with me as an outpatient for long term  management. We will coordinate.   2D echo - normal EF  Antimicrobials:  None   Signed:  Manson Passey, MD  [facility] 06/18/2017, 11:55 AM  Pager #: [phone]  Time spent in minutes: more than 30 minutes    Discharge Exam: Vitals:   06/17/17 2037 06/18/17 0521  BP: (!) 125/99 (!) 124/96  Pulse: 70 72  Resp: 17 16  Temp: 98.1 F (36.7 C) 98.3 F (36.8 C)   SpO2: 100% 100%   Vitals:   06/17/17  1032 06/17/17 1534 06/17/17 2037 06/18/17 0521  BP: 128/73 (!) 117/95 (!) 125/99 (!) 124/96  Pulse: 72 67 70 72  Resp: 18 19 17 16   Temp: 97.6 F (36.4 C) 97.7 F (36.5 C) 98.1 F (36.7 C) 98.3 F (36.8 C)  TempSrc: Oral Oral Oral Oral  SpO2: 100% 100% 100% 100%  Weight:      Height:        General: Pt is alert, follows commands appropriately, not in acute distress Cardiovascular: Regular rate and rhythm, S1/S2 +, no murmurs Respiratory: Clear to auscultation bilaterally, no wheezing, no crackles, no rhonchi Abdominal: Soft, non tender, non distended, bowel sounds +, no guarding Extremities: no edema, no cyanosis, pulses palpable bilaterally DP and PT Neuro: Grossly nonfocal  Discharge Instructions  Discharge Instructions    Call MD for:  persistant nausea and vomiting    Complete by:  As directed    Call MD for:  redness, tenderness, or signs of infection (pain, swelling, redness, odor or green/yellow discharge around incision site)    Complete by:  As directed    Call MD for:  severe uncontrolled pain    Complete by:  As directed    Diet - low sodium heart healthy    Complete by:  As directed    Discharge instructions    Complete by:  As directed    Continue fluconazole fr 2 weeks on discharge Follow up with GI on outpatient basis Continue vanco by mouth through 10/7   Increase activity slowly    Complete by:  As directed      Allergies as of 06/18/2017   No Known Allergies     Medication List    STOP taking these medications   ibuprofen 200 MG tablet Commonly known as:  ADVIL,MOTRIN     TAKE these medications   famotidine 10 MG tablet Commonly known as:  PEPCID AC Take 1 tablet (10 mg total) by mouth 2 (two) times daily.   fluconazole 200 MG tablet Commonly known as:  DIFLUCAN Take 1 tablet (200 mg total) by mouth daily.   folic acid 1 MG tablet Commonly known as:  FOLVITE Take 1 tablet (1 mg total) by mouth  daily.   potassium chloride 10 MEQ tablet Commonly known as:  K-DUR Take 1 tablet (10 mEq total) by mouth daily.   vancomycin 50 mg/mL oral solution Commonly known as:  VANCOCIN Take 2.5 mLs (125 mg total) by mouth every 6 (six) hours.            Discharge Care Instructions        Start     Ordered   06/19/17 0000  fluconazole (DIFLUCAN) 200 MG tablet  Daily     06/18/17 1154   06/19/17 0000  folic acid (FOLVITE) 1 MG tablet  Daily     06/18/17 1154   06/18/17 0000  vancomycin (VANCOCIN) 50 mg/mL oral solution  Every 6 hours     06/18/17 1154   06/18/17 0000  Increase activity slowly     06/18/17 1154   06/18/17 0000  Diet - low sodium heart healthy     06/18/17 1154   06/18/17 0000  Call MD for:  persistant nausea and vomiting     06/18/17 1154   06/18/17 0000  Call MD for:  severe uncontrolled pain     06/18/17 1154   06/18/17 0000  Call MD for:  redness, tenderness, or signs of infection (pain, swelling, redness, odor or green/yellow discharge  around incision site)     06/18/17 1154   06/18/17 0000  famotidine (PEPCID AC) 10 MG tablet  2 times daily     06/18/17 1154   06/18/17 0000  Discharge instructions    Comments:  Continue fluconazole fr 2 weeks on discharge Follow up with GI on outpatient basis Continue vanco by mouth through 10/7 Tae potassium for 5 days on discharge   06/18/17 1156   06/18/17 0000  potassium chloride (K-DUR) 10 MEQ tablet  Daily     06/18/17 1156         The results of significant diagnostics from this hospitalization (including imaging, microbiology, ancillary and laboratory) are listed below for reference.    Significant Diagnostic Studies: Dg Chest 2 View  Result Date: 06/14/2017 CLINICAL DATA:  Dehydrated, weak EXAM: CHEST  2 VIEW COMPARISON:  None. FINDINGS: The heart size and mediastinal contours are within normal limits. Both lungs are clear. The visualized skeletal structures are unremarkable. IMPRESSION: No active  cardiopulmonary disease. Electronically Signed   By: Jasmine Pang M.D.   On: 06/14/2017 22:38   Ct Abdomen Pelvis W Contrast  Result Date: 06/15/2017 CLINICAL DATA:  Nausea, vomiting and dysphagia. Upper abdominal pain. EXAM: CT ABDOMEN AND PELVIS WITH CONTRAST TECHNIQUE: Multidetector CT imaging of the abdomen and pelvis was performed using the standard protocol following bolus administration of intravenous contrast. CONTRAST:  30mL ISOVUE-300 IOPAMIDOL (ISOVUE-300) INJECTION 61%, ISOVUE-300 IOPAMIDOL (ISOVUE-300) INJECTION 61% COMPARISON:  None. FINDINGS: Lower chest: The lung bases are clear of acute process. No infiltrates, edema or effusions. No worrisome pulmonary lesions. The heart is normal in size. No pericardial effusion. Age advanced coronary artery calcifications are noted. Moderate distal esophageal wall thickening above a moderate-sized hiatal hernia. Findings could be due to reflux esophagitis. Esophagram or endoscopy may be helpful for further evaluation and to exclude neoplasm or Barrett's esophagus. Hepatobiliary: Diffuse and fairly marked fatty infiltration of the liver. There are also changes of cirrhosis. No focal hepatic lesions or intrahepatic biliary dilatation. The gallbladder is contracted and contains gallstones. No common bile duct dilatation. Pancreas: No mass, inflammation or ductal dilatation. Small duodenum diverticulum noted near the pancreatic head. Spleen: Normal size.  No focal lesions. Adrenals/Urinary Tract: The adrenal glands and kidneys are unremarkable. Small renal cysts are noted. No renal, ureteral or bladder calculi or mass. Stomach/Bowel: The stomach and duodenum are unremarkable. The small bowel is grossly normal. The proximal small bowel is normal. There are no distal loops of small bowel which demonstrate inflammatory changes with wall thickening and submucosal edema. This most notably involves the distal and terminal ileum. There are also inflammatory  changes involving the ascending colon with colonic wall thickening and submucosal edema. There are also fibrofatty type changes involving the cecum which would suggest chronic changes of inflammatory bowel disease. No mass lesions or obstructive findings. Scattered sigmoid diverticulosis and muscular wall thickening. The appendix is normal. Vascular/Lymphatic: Distal aortic and iliac artery calcifications, advanced for age. No aneurysm or dissection. The branch vessels are normal. The major venous structures are patent. Portal venous collaterals are noted. Possible early esophageal varices. Small scattered mesenteric and retroperitoneal lymph nodes but no mass or overt adenopathy. Reproductive: The prostate gland and seminal vesicles are unremarkable. Other: Small left inguinal hernia containing fat. No inguinal adenopathy. Musculoskeletal: No significant bony findings. Advanced degenerative disc disease noted at L5-S1. IMPRESSION: 1. Moderate distal esophageal wall thickening and a moderate sized hiatal hernia. Findings could be due to reflux esophagitis but esophagram  or endoscopy may be helpful to exclude neoplasm or Barrett's esophagus. 2. Age advanced vascular calcifications including coronary artery calcifications. 3. Diffuse fatty infiltration of the liver and changes of cirrhosis without focal hepatic lesion. Portal venous hypertension but no splenomegaly or ascites. 4. Inflammatory bowel disease mainly involving segmental sections of the distal ileum and terminal ileum. There are also acute and chronic inflammatory changes involving the cecum and ascending colon. Does this patient have a history of Crohn's disease? 5. Contracted gallbladder with gallstones. Electronically Signed   By: Rudie Meyer M.D.   On: 06/15/2017 12:47    Microbiology: Recent Results (from the past 240 hour(s))  Gastrointestinal Panel by PCR , Stool     Status: None   Collection Time: 06/15/17 12:44 AM  Result Value Ref Range  Status   Campylobacter species NOT DETECTED NOT DETECTED Final   Plesimonas shigelloides NOT DETECTED NOT DETECTED Final   Salmonella species NOT DETECTED NOT DETECTED Final   Yersinia enterocolitica NOT DETECTED NOT DETECTED Final   Vibrio species NOT DETECTED NOT DETECTED Final   Vibrio cholerae NOT DETECTED NOT DETECTED Final   Enteroaggregative E coli (EAEC) NOT DETECTED NOT DETECTED Final   Enteropathogenic E coli (EPEC) NOT DETECTED NOT DETECTED Final   Enterotoxigenic E coli (ETEC) NOT DETECTED NOT DETECTED Final   Shiga like toxin producing E coli (STEC) NOT DETECTED NOT DETECTED Final   Shigella/Enteroinvasive E coli (EIEC) NOT DETECTED NOT DETECTED Final   Cryptosporidium NOT DETECTED NOT DETECTED Final   Cyclospora cayetanensis NOT DETECTED NOT DETECTED Final   Entamoeba histolytica NOT DETECTED NOT DETECTED Final   Giardia lamblia NOT DETECTED NOT DETECTED Final   Adenovirus F40/41 NOT DETECTED NOT DETECTED Final   Astrovirus NOT DETECTED NOT DETECTED Final   Norovirus GI/GII NOT DETECTED NOT DETECTED Final   Rotavirus A NOT DETECTED NOT DETECTED Final   Sapovirus (I, II, IV, and V) NOT DETECTED NOT DETECTED Final  C difficile quick scan w PCR reflex     Status: Abnormal   Collection Time: 06/15/17 12:44 AM  Result Value Ref Range Status   C Diff antigen NEGATIVE NEGATIVE Final   C Diff toxin POSITIVE (A) NEGATIVE Final    Comment: CRITICAL RESULT CALLED TO, READ BACK BY AND VERIFIED WITH: DARK,T RN [id number] COV[place],N    C Diff interpretation Results are indeterminate. See PCR results.  Final  Clostridium Difficile by PCR     Status: None   Collection Time: 06/15/17 12:44 AM  Result Value Ref Range Status   Toxigenic C Difficile by pcr NEGATIVE NEGATIVE Final    Comment: Patient is colonized with non toxigenic C. difficile. May not need treatment unless significant symptoms are present. Performed at [facility], [address] [place], [place] [address]       Labs: Basic Metabolic Panel:  Recent Labs Lab 06/14/17 2054 06/15/17 9811  06/16/17 0450 06/16/17 1554 06/17/17 0440 06/17/17 1416 06/18/17 0709  NA  --  144  < > 141 141 141 142 139  K  --  <2.0*  < > <2.0* 2.4* 2.8* 3.5 3.1*  CL  --  100*  < > 103 106 106 109 105  CO2  --  34*  < > GLUCOSE  --  102*  < > 110* 85 88 89 94  BUN  --  6  < > <5* <5* <5* <5* 6  CREATININE  --  1.18  < >  1.07 1.01 1.01 1.04 1.02  CALCIUM  --  6.1*  < > 6.4* 6.5* 6.8* 6.9* 7.3*  MG 1.0* 1.5*  --  1.9  --   --   --  1.1*  PHOS 3.0  --   --   --   --   --   --   --   < > = values in this interval not displayed. Liver Function Tests:  Recent Labs Lab 06/14/17 1942 06/15/17 0649 06/16/17 0450  AST 104* 82* 76*  ALT ALKPHOS 82 64 73  BILITOT 2.1* 1.6* 1.6*  PROT 6.8 5.4* 5.3*  ALBUMIN 3.3* 2.6* 2.7*    Recent Labs Lab 06/14/17 1942 06/15/17 0649  LIPASE 84* 108*   No results for input(s): AMMONIA in the last 168 hours. CBC:  Recent Labs Lab 06/14/17 1825 06/15/17 0102 06/15/17 0649 06/16/17 0450 06/17/17 0440  WBC 14.9*  --  9.3 9.2 9.0  HGB 11.8*  --  10.3* 9.9* 10.1*  HCT 32.1* 30.3* 27.6* 26.9* 27.8*  MCV 97.9  --  100.4* 98.9 104.1*  PLT 149*  --  106* 97* 106*   Cardiac Enzymes:  Recent Labs Lab 06/14/17 2103 06/15/17 0102 06/15/17 0649 06/15/17 1147  CKTOTAL  --  5,965*  --   --   CKMB  --  5.0  --   --   TROPONINI 0.10* 0.09* 0.07* 0.07*   BNP: BNP (last 3 results) No results for input(s): BNP in the last 8760 hours.  ProBNP (last 3 results) No results for input(s): PROBNP in the last 8760 hours.  CBG: No results for input(s): GLUCAP in the last 168 hours.

## 2017-06-18 NOTE — Progress Notes (Signed)
Progress Note  Patient Name: Michael Reeves Date of Encounter: 06/18/2017  Primary Cardiologist:  Dr. Elease Hashimoto  Subjective   No chest pain.  No SOB.  Anxious to go home  Inpatient Medications    Scheduled Meds: . enoxaparin (LOVENOX) injection  40 mg Subcutaneous Q24H  . fluconazole  200 mg Oral Daily  . folic acid  1 mg Oral Daily  . Influenza vac split quadrivalent PF  0.5 mL Intramuscular Tomorrow-1000  . LORazepam  0-4 mg Intravenous Q12H  . multivitamin with minerals  1 tablet Oral Daily  . pantoprazole  20 mg Oral Daily  . pneumococcal 23 valent vaccine  0.5 mL Intramuscular Tomorrow-1000  . thiamine  100 mg Oral Daily  . vancomycin  125 mg Oral Q6H   Continuous Infusions:  PRN Meds: acetaminophen, HYDROcodone-acetaminophen   Vital Signs    Vitals:   06/17/17 1032 06/17/17 1534 06/17/17 2037 06/18/17 0521  BP: 128/73 (!) 117/95 (!) 125/99 (!) 124/96  Pulse: 72 67 70 72  Resp: Temp: 97.6 F (36.4 C) 97.7 F (36.5 C) 98.1 F (36.7 C) 98.3 F (36.8 C)  TempSrc: Oral Oral Oral Oral  SpO2: 100% 100% 100% 100%  Weight:      Height:        Intake/Output Summary (Last 24 hours) at 06/18/17 1131 Last data filed at 06/18/17 0700  Gross per 24 hour  Intake          1573.75 ml  Output                0 ml  Net          1573.75 ml   Filed Weights   06/16/17 0351 06/17/17 0614 06/17/17 0843  Weight: 181 lb 7 oz (82.3 kg) 190 lb 4.8 oz (86.3 kg) 181 lb (82.1 kg)    Telemetry    Tele was discontinued. - Personally Reviewed  ECG    NA - Personally Reviewed  Physical Exam   GEN: No acute distress.   Neck: No  JVD Cardiac: RRR, no murmurs, rubs, or gallops.  Respiratory: Clear  to auscultation bilaterally. GI: Soft, nontender, non-distended  MS: No  edema; No deformity. Neuro:  Nonfocal  Psych: Normal affect   Labs    Chemistry Recent Labs Lab 06/14/17 1942 06/15/17 4782  06/16/17 0450  06/17/17 0440 06/17/17 1416 06/18/17 0709    NA 136 144  < > 141  < > 141 142 139  K <2.0* <2.0*  < > <2.0*  < > 2.8* 3.5 3.1*  CL 86* 100*  < > 103  < > 106 109 105  CO2 36* 34*  < > 30  < > GLUCOSE 98 102*  < > 110*  < > 88 89 94  BUN 7 6  < > <5*  < > <5* <5* 6  CREATININE 1.27* 1.18  < > 1.07  < > 1.01 1.04 1.02  CALCIUM 6.9* 6.1*  < > 6.4*  < > 6.8* 6.9* 7.3*  PROT 6.8 5.4*  --  5.3*  --   --   --   --   ALBUMIN 3.3* 2.6*  --  2.7*  --   --   --   --   AST 104* 82*  --  76*  --   --   --   --   ALT 28 24  --  24  --   --   --   --  ALKPHOS 82 64  --  73  --   --   --   --   BILITOT 2.1* 1.6*  --  1.6*  --   --   --   --   GFRNONAA >60 >60  < > >60  < > >60 >60 >60  GFRAA >60 >60  < > >60  < > >60 >60 >60  ANIONGAP 14 10  < > 8  < > < > = values in this interval not displayed.   Hematology Recent Labs Lab 06/15/17 0649 06/16/17 0450 06/17/17 0440  WBC 9.3 9.2 9.0  RBC 2.75* 2.72* 2.67*  HGB 10.3* 9.9* 10.1*  HCT 27.6* 26.9* 27.8*  MCV 100.4* 98.9 104.1*  MCH 37.5* 36.4* 37.8*  MCHC 37.3* 36.8* 36.3*  RDW 13.5 13.3 13.4  PLT 106* 97* 106*    Cardiac Enzymes Recent Labs Lab 06/14/17 2103 06/15/17 0102 06/15/17 0649 06/15/17 1147  TROPONINI 0.10* 0.09* 0.07* 0.07*   No results for input(s): TROPIPOC in the last 168 hours.   BNPNo results for input(s): BNP, PROBNP in the last 168 hours.   DDimer No results for input(s): DDIMER in the last 168 hours.   Radiology    No results found.  Cardiac Studies   ECHO:    Study Conclusions  - Left ventricle: The cavity size was normal. Wall thickness was   increased in a pattern of mild LVH. Systolic function was normal.   The estimated ejection fraction was in the range of 60% to 65%.  Patient Profile     46 y.o. male with a hx of Alcohol abusewho was admitted 06/14/17 for N/V/dysphasia and diarrhea.  + Cdiff.  Chronic Heartburn.  K+ 1.2 on admit and Mg+ 1.0 along with abnormal EKG and elevated troponin level.  Assessment & Plan     ELEVATED TROPONIN:   This was non specific in this situation.  I would suggest an out patient POET (Plain Old Exercise Treadmill).  We will arrange follow up.    HYPOKALEMIA:  Improved but not normal.   Continue supplement per primary team.    PROLONGED QT:  Improved but still not normal.  Need to be cautious with drugs prolonging the QT and weight risk vs benefit.   We will follow up an EKG in the office.   We will sign off.  Please call with further questions.    Signed, Rollene Rotunda, MD  06/18/2017, 11:31 AM

## 2017-06-18 NOTE — Discharge Instructions (Addendum)
Vancomycin oral solution What is this medicine? VANCOMYCIN Lucianne Lei koe MYE sin) is a glycopeptide antibiotic. It is used to treat certain kinds of bacterial infections in the bowel. It will not work for colds, flu, or other viral infections. This medicine may be used for other purposes; ask your health care provider or pharmacist if you have questions. COMMON BRAND NAME(S): Vancocin What should I tell my health care provider before I take this medicine? They need to know if you have any of these conditions: -dehydration -inflammatory bowel disease -kidney disease -other chronic illness -an unusual or allergic reaction to vancomycin, other medicines, foods, dyes, or preservatives -pregnant or trying to get pregnant -breast-feeding How should I use this medicine? Take this medicine by mouth. Follow the directions on the prescription label. Shake well before using. Use a specially marked spoon or dropper to measure each dose. Ask your pharmacist if you do not have one. Household spoons are not accurate. Take your medicine at regular intervals. Do not take your medicine more often than directed. Take all of your medicine as directed even if you think you are better. Do not skip doses or stop your medicine early. Talk to your pediatrician regarding the use of this medicine in children. Special care may be needed. Overdosage: If you think you have taken too much of this medicine contact a poison control center or emergency room at once. NOTE: This medicine is only for you. Do not share this medicine with others. What if I miss a dose? If you miss a dose, take it as soon as you can. If it is almost time for your next dose, take only that dose. Do not take double or extra doses. What may interact with this medicine? -birth control pills -cholestyramine -colestipol -vancomycin injection This list may not describe all possible interactions. Give your health care provider a list of all the medicines,  herbs, non-prescription drugs, or dietary supplements you use. Also tell them if you smoke, drink alcohol, or use illegal drugs. Some items may interact with your medicine. What should I watch for while using this medicine? Tell your doctor or health care professional if your symptoms do not improve or if you get new symptoms. Avoid taking this medicine within 3 or 4 hours of taking cholestyramine or colestipol. What side effects may I notice from receiving this medicine? Side effects that you should report to your doctor or health care professional as soon as possible: -allergic reactions like skin rash, itching or hives, swelling of the face, lips, or tongue -breathing difficulty -change in amount, color of urine -change in hearing -dizziness -fever, infection -redness, blistering, peeling or loosening of the skin, including inside the mouth -unusual bleeding or bruising -unusually weak or tired Side effects that usually do not require medical attention (report to your doctor or health care professional if they continue or are bothersome): -nausea, vomiting -stomach cramps This list may not describe all possible side effects. Call your doctor for medical advice about side effects. You may report side effects to FDA at 1-800-FDA-1088. Where should I keep my medicine? Keep out of the reach of children. After this medicine is mixed by your pharmacist, store it in a refrigerator between 2 and 8 degrees C (36 and 46 degrees F). Do not freeze. Throw away any unused medicine after 14 days. NOTE: This sheet is a summary. It may not cover all possible information. If you have questions about this medicine, talk to your doctor, pharmacist, or health care  provider.  2018 Elsevier/Gold Standard (2013-04-16 14:46:53) Fluconazole tablets What is this medicine? FLUCONAZOLE (floo KON na zole) is an antifungal medicine. It is used to treat certain kinds of fungal or yeast infections. This medicine may  be used for other purposes; ask your health care provider or pharmacist if you have questions. COMMON BRAND NAME(S): Diflucan What should I tell my health care provider before I take this medicine? They need to know if you have any of these conditions: -history of irregular heart beat -kidney disease -an unusual or allergic reaction to fluconazole, other azole antifungals, medicines, foods, dyes, or preservatives -pregnant or trying to get pregnant -breast-feeding How should I use this medicine? Take this medicine by mouth. Follow the directions on the prescription label. Do not take your medicine more often than directed. Talk to your pediatrician regarding the use of this medicine in children. Special care may be needed. This medicine has been used in children as young as 37 months of age. Overdosage: If you think you have taken too much of this medicine contact a poison control center or emergency room at once. NOTE: This medicine is only for you. Do not share this medicine with others. What if I miss a dose? If you miss a dose, take it as soon as you can. If it is almost time for your next dose, take only that dose. Do not take double or extra doses. What may interact with this medicine? Do not take this medicine with any of the following medications: -astemizole -certain medicines for irregular heart beat like dofetilide, dronedarone, quinidine -cisapride -erythromycin -lomitapide -other medicines that prolong the QT interval (cause an abnormal heart rhythm) -pimozide -terfenadine -thioridazine -tolvaptan -ziprasidone This medicine may also interact with the following medications: -antiviral medicines for HIV or AIDS -birth control pills -certain antibiotics like rifabutin, rifampin -certain medicines for blood pressure like amlodipine, isradipine, felodipine, hydrochlorothiazide, losartan, nifedipine -certain medicines for cancer like cyclophosphamide, vinblastine,  vincristine -certain medicines for cholesterol like atorvastatin, lovastatin, fluvastatin, simvastatin -certain medicines for depression, anxiety, or psychotic disturbances like amitriptyline, midazolam, nortriptyline, triazolam -certain medicines for diabetes like glipizide, glyburide, tolbutamide -certain medicines for pain like alfentanil, fentanyl, methadone -certain medicines for seizures like carbamazepine, phenytoin -certain medicines that treat or prevent blood clots like warfarin -halofantrine -medicines that lower your chance of fighting infection like cyclosporine, prednisone, tacrolimus -NSAIDS, medicines for pain and inflammation, like celecoxib, diclofenac, flurbiprofen, ibuprofen, meloxicam, naproxen -other medicines for fungal infections -sirolimus -theophylline -tofacitinib This list may not describe all possible interactions. Give your health care provider a list of all the medicines, herbs, non-prescription drugs, or dietary supplements you use. Also tell them if you smoke, drink alcohol, or use illegal drugs. Some items may interact with your medicine. What should I watch for while using this medicine? Visit your doctor or health care professional for regular checkups. If you are taking this medicine for a long time you may need blood work. Tell your doctor if your symptoms do not improve. Some fungal infections need many weeks or months of treatment to cure. Alcohol can increase possible damage to your liver. Avoid alcoholic drinks. If you have a vaginal infection, do not have sex until you have finished your treatment. You can wear a sanitary napkin. Do not use tampons. Wear freshly washed cotton, not synthetic, panties. What side effects may I notice from receiving this medicine? Side effects that you should report to your doctor or health care professional as soon as possible: -allergic reactions like skin  rash or itching, hives, swelling of the lips, mouth, tongue, or  throat -dark urine -feeling dizzy or faint -irregular heartbeat or chest pain -redness, blistering, peeling or loosening of the skin, including inside the mouth -trouble breathing -unusual bruising or bleeding -vomiting -yellowing of the eyes or skin Side effects that usually do not require medical attention (report to your doctor or health care professional if they continue or are bothersome): -changes in how food tastes -diarrhea -headache -stomach upset or nausea This list may not describe all possible side effects. Call your doctor for medical advice about side effects. You may report side effects to FDA at 1-800-FDA-1088. Where should I keep my medicine? Keep out of the reach of children. Store at room temperature below 30 degrees C (86 degrees F). Throw away any medicine after the expiration date. NOTE: This sheet is a summary. It may not cover all possible information. If you have questions about this medicine, talk to your doctor, pharmacist, or health care provider.  2018 Elsevier/Gold Standard (2013-04-17 19:37:38)

## 2017-06-18 NOTE — Progress Notes (Signed)
Patient given discharge, follow up, and medication instructions, verbalized understanding, IV removed, prescriptions in hand, meeting ride at lobby entrance, refused WC and staff transport downstairs to lobby

## 2017-06-20 ENCOUNTER — Encounter: Payer: Self-pay | Admitting: Cardiology

## 2017-07-01 ENCOUNTER — Ambulatory Visit: Payer: Self-pay | Admitting: Cardiology

## 2017-07-07 ENCOUNTER — Ambulatory Visit: Payer: Self-pay | Admitting: Nurse Practitioner

## 2017-07-17 ENCOUNTER — Ambulatory Visit: Payer: Self-pay | Admitting: Physician Assistant

## 2017-07-17 NOTE — Progress Notes (Deleted)
Cardiology Office Note    Date:  07/17/2017   ID:  Michael Reeves, DOB [birth year], MRN [id number]  PCP:  Patient, No Pcp Per  Cardiologist:  Michael Reeves  Chief Complaint: Hospital follow up for elevated troponin  History of Present Illness:   Michael Reeves is a 46 y.o. male  with a hx of Alcohol abuse presents for hospital follow up.   Admitted 06/14/17 for N/V/dysphasia and diarrhea. + Cdiff. Chronic Heartburn. K+ 1.2 on admit and Mg+ 1.0 along with abnormal EKG and elevated troponin level.felt demand. Echo showed normal LVEF. Recommended outpatient POET.  Here for follow up.    No past medical history on file.  Past Surgical History:  Procedure Laterality Date  . ESOPHAGOGASTRODUODENOSCOPY (EGD) WITH PROPOFOL N/A 06/17/2017   Procedure: ESOPHAGOGASTRODUODENOSCOPY (EGD) WITH PROPOFOL;  Surgeon: Michael Deeds, MD;  Location: [facility];  Service: Gastroenterology;  Laterality: N/A;  possible dilation    Current Medications: Prior to Admission medications   Medication Sig Start Date End Date Taking? Authorizing Provider  famotidine (PEPCID AC) 10 MG tablet Take 1 tablet (10 mg total) by mouth 2 (two) times daily. 06/18/17   Michael Murray, MD  fluconazole (DIFLUCAN) 200 MG tablet Take 1 tablet (200 mg total) by mouth daily. 06/19/17   Michael Murray, MD  folic acid (FOLVITE) 1 MG tablet Take 1 tablet (1 mg total) by mouth daily. 06/19/17   Michael Murray, MD  potassium chloride (K-DUR) 10 MEQ tablet Take 1 tablet (10 mEq total) by mouth daily. 06/18/17   Michael Murray, MD    Allergies:   Patient has no known allergies.   Social History   Social History  . Marital status: Married    Spouse name: N/A  . Number of children: N/A  . Years of education: N/A   Social History Main Topics  . Smoking status: Current Every Day Smoker  . Smokeless tobacco: Never Used  . Alcohol use Yes  . Drug use: No  . Sexual activity: No   Other Topics Concern  . Not on file    Social History Narrative  . No narrative on file     Family History:  The patient's family history includes Acute myelogenous leukemia in his father; COPD in his mother; Cancer - Colon in his mother. ***  ROS:   Please see the history of present illness.    ROS All other systems reviewed and are negative.   PHYSICAL EXAM:   VS:  There were no vitals taken for this visit.   GEN: Well nourished, well developed, in no acute distress  HEENT: normal  Neck: no JVD, carotid bruits, or masses Cardiac: ***RRR; no murmurs, rubs, or gallops,no edema  Respiratory:  clear to auscultation bilaterally, normal work of breathing GI: soft, nontender, nondistended, + BS MS: no deformity or atrophy  Skin: warm and dry, no rash Neuro:  Alert and Oriented x 3, Strength and sensation are intact Psych: euthymic mood, full affect  Wt Readings from Last 3 Encounters:  06/17/17 181 lb (82.1 kg)  01/05/17 193 lb (87.5 kg)      Studies/Labs Reviewed:   EKG:  EKG is ordered today.  The ekg ordered today demonstrates ***  Recent Labs: 06/15/2017: TSH 2.020 06/16/2017: ALT 24 06/17/2017: Hemoglobin 10.1; Platelets 106 06/18/2017: BUN 6; Creatinine, Ser 1.02; Magnesium 1.1; Potassium 3.1; Sodium 139   Lipid Panel No results found for: CHOL, TRIG, HDL, CHOLHDL, VLDL, LDLCALC, LDLDIRECT  Additional studies/ records  that were reviewed today include:   Echocardiogram 06/15/17 Study Conclusions  - Left ventricle: The cavity size was normal. Wall thickness was   increased in a pattern of mild LVH. Systolic function was normal.   The estimated ejection fraction was in the range of 60% to 65%.     ASSESSMENT & PLAN:    1. Recent elevated troponin - Felt demand in setting of elevated troponin. Echocardiogram reassuring and discuss above. Will get POET as recommended by Dr. Antoine Reeves.  2. Prolonged QT    Medication Adjustments/Labs and Tests Ordered: Current medicines are reviewed at length  with the patient today.  Concerns regarding medicines are outlined above.  Medication changes, Labs and Tests ordered today are listed in the Patient Instructions below. There are no Patient Instructions on file for this visit.   Michael PontSigned, Michael Reeves, [place]  07/17/2017 8:27 AM    [facility] [address], [place], [place]  [address] Phone: [phone]) [phone]; Fax: [phone]

## 2018-02-21 DEATH — deceased

## 2018-05-04 IMAGING — CR DG WRIST COMPLETE 3+V*L*
4 series · 4 of 4 positions shown · non-contrast
Comparison: None.

CLINICAL DATA: Pain in the left hand with swelling of the left
wrist

EXAM:
LEFT WRIST - COMPLETE 3+ VIEW

[x wrist pa left]
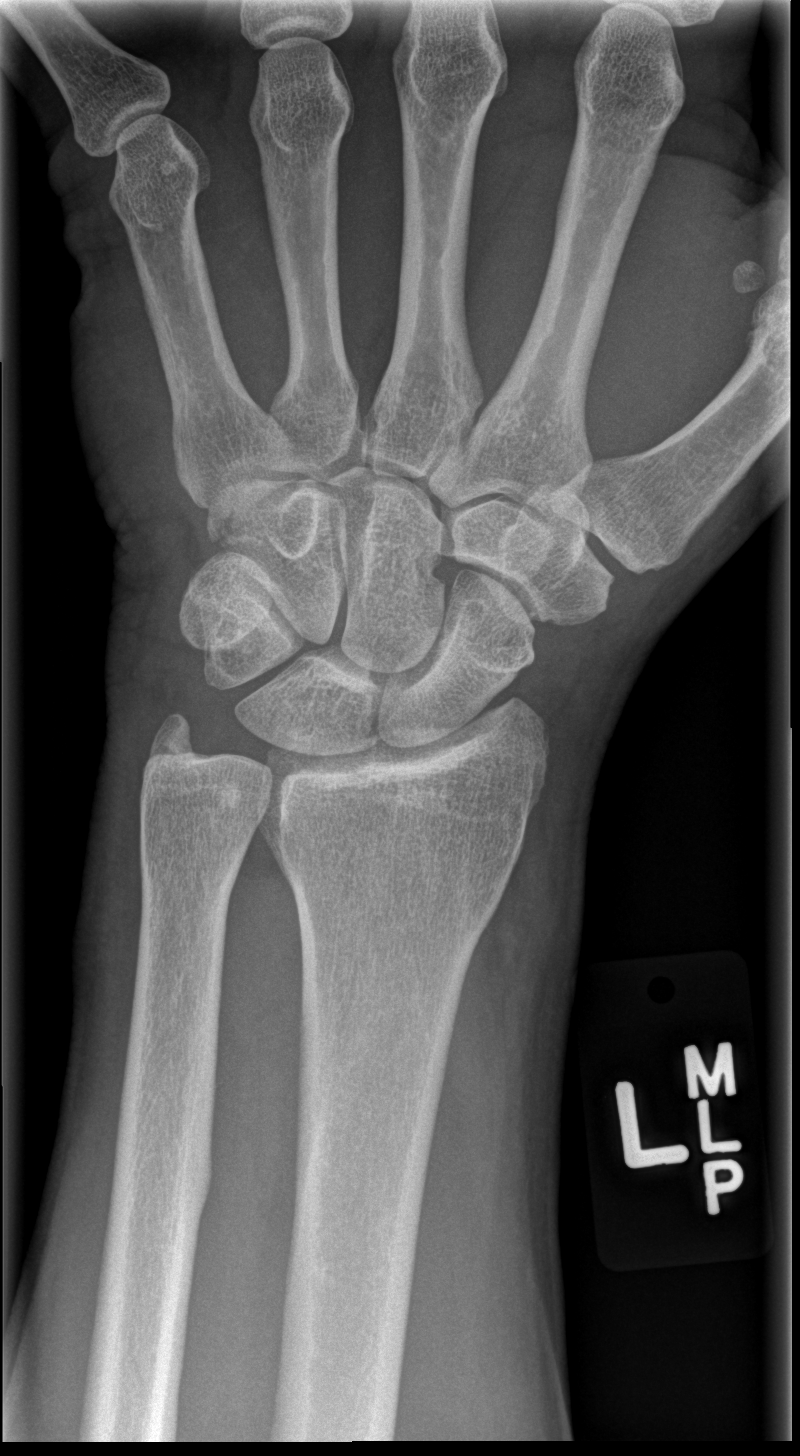

[x wrist obl left]
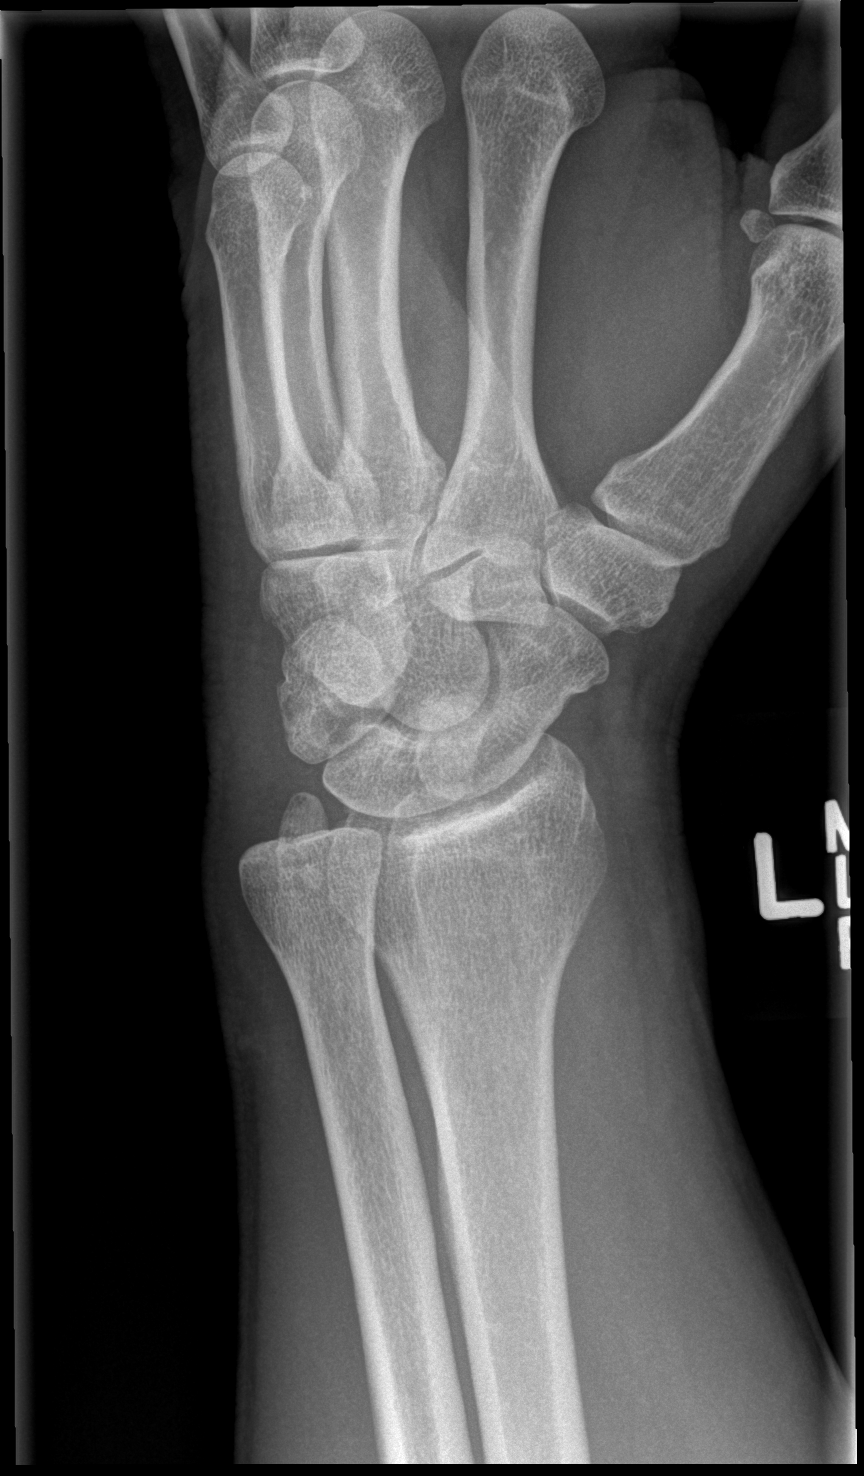

[x wrist lat left]
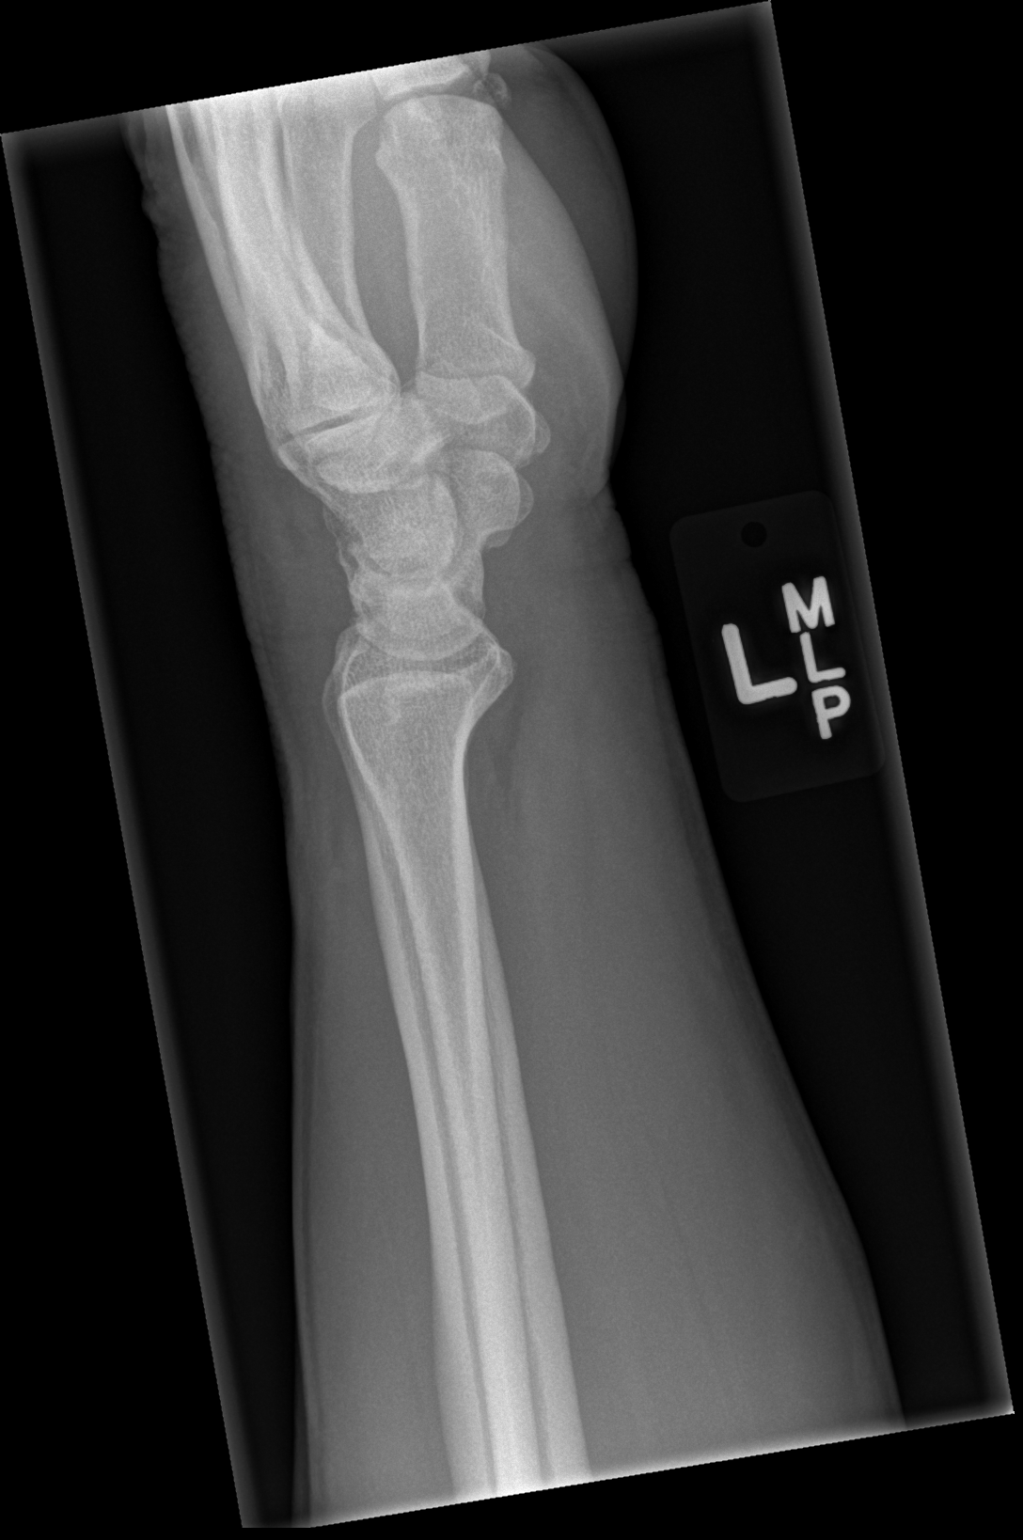

[x wrist navicular view left]
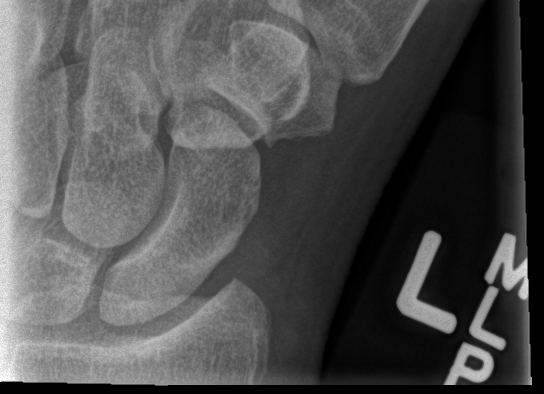

[4 of 4 positions shown; findings below may reference images not displayed]

FINDINGS: No fracture or malalignment is visualized. No significant joint
space narrowing. Probable cysts in the distal scaphoid and the
triquetrum. Soft tissues are grossly unremarkable.
IMPRESSION: No acute osseous abnormality.
